# Patient Record
Sex: Female | Born: 1980
Health system: Southern US, Community
[De-identification: ages and names within clinical notes are randomized; demographics above are authoritative.]

## PROBLEM LIST (undated history)

## (undated) DIAGNOSIS — Z789 Other specified health status: Secondary | ICD-10-CM

## (undated) HISTORY — PX: NO PAST SURGERIES: SHX2092

---

## 2000-01-13 ENCOUNTER — Other Ambulatory Visit: Admission: RE | Admit: 2000-01-13 | Discharge: 2000-01-13 | Payer: Self-pay | Admitting: Family Medicine

## 2000-02-14 ENCOUNTER — Ambulatory Visit (HOSPITAL_COMMUNITY): Admission: RE | Admit: 2000-02-14 | Discharge: 2000-02-14 | Payer: Self-pay | Admitting: Family Medicine

## 2000-02-14 ENCOUNTER — Encounter: Payer: Self-pay | Admitting: Family Medicine

## 2000-07-26 ENCOUNTER — Inpatient Hospital Stay (HOSPITAL_COMMUNITY): Admission: AD | Admit: 2000-07-26 | Discharge: 2000-07-28 | Payer: Self-pay | Admitting: Family Medicine

## 2001-12-19 ENCOUNTER — Other Ambulatory Visit: Admission: RE | Admit: 2001-12-19 | Discharge: 2001-12-19 | Payer: Self-pay | Admitting: Unknown Physician Specialty

## 2001-12-19 ENCOUNTER — Other Ambulatory Visit: Admission: RE | Admit: 2001-12-19 | Discharge: 2001-12-19 | Payer: Self-pay | Admitting: Family Medicine

## 2002-02-15 ENCOUNTER — Encounter: Payer: Self-pay | Admitting: Family Medicine

## 2002-02-15 ENCOUNTER — Ambulatory Visit (HOSPITAL_COMMUNITY): Admission: RE | Admit: 2002-02-15 | Discharge: 2002-02-15 | Payer: Self-pay | Admitting: Family Medicine

## 2002-04-26 ENCOUNTER — Ambulatory Visit (HOSPITAL_COMMUNITY): Admission: RE | Admit: 2002-04-26 | Discharge: 2002-04-26 | Payer: Self-pay | Admitting: Family Medicine

## 2002-04-26 ENCOUNTER — Encounter: Payer: Self-pay | Admitting: Family Medicine

## 2002-07-21 ENCOUNTER — Inpatient Hospital Stay (HOSPITAL_COMMUNITY): Admission: AD | Admit: 2002-07-21 | Discharge: 2002-07-23 | Payer: Self-pay | Admitting: Family Medicine

## 2005-11-16 ENCOUNTER — Other Ambulatory Visit: Admission: RE | Admit: 2005-11-16 | Discharge: 2005-11-16 | Payer: Self-pay | Admitting: Obstetrics and Gynecology

## 2006-02-10 ENCOUNTER — Ambulatory Visit (HOSPITAL_COMMUNITY): Admission: RE | Admit: 2006-02-10 | Discharge: 2006-02-10 | Payer: Self-pay | Admitting: Obstetrics and Gynecology

## 2006-06-25 ENCOUNTER — Inpatient Hospital Stay (HOSPITAL_COMMUNITY): Admission: AD | Admit: 2006-06-25 | Discharge: 2006-06-27 | Payer: Self-pay | Admitting: Obstetrics and Gynecology

## 2008-01-13 IMAGING — US US OB COMP +14 WK
1 series · 13 of 28 positions shown · non-contrast
Comparison: none

CLINICAL DATA: Evaluate anatomy.

[Series 1: us ob comp +14 wk · 0.35mm/px · 13 of 94 slices shown]
[im 4/94]
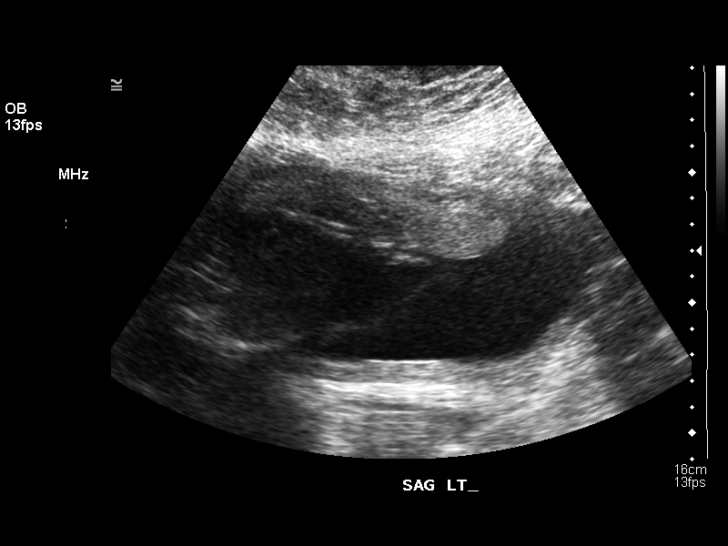
[im 11/94]
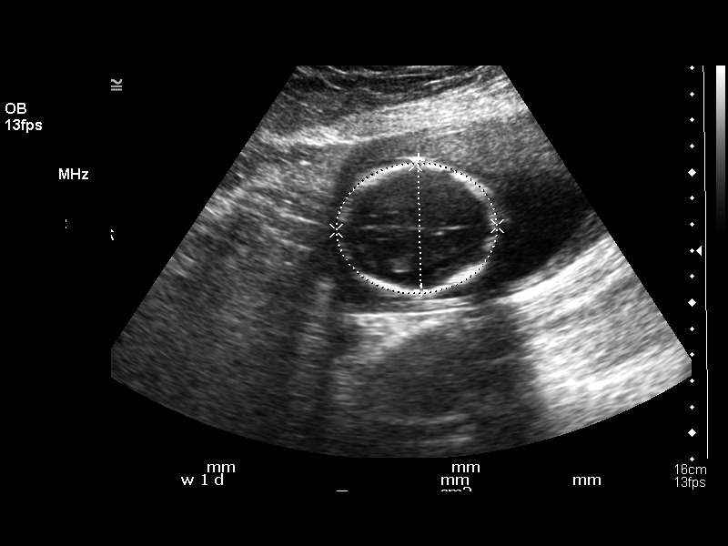
[im 18/94]
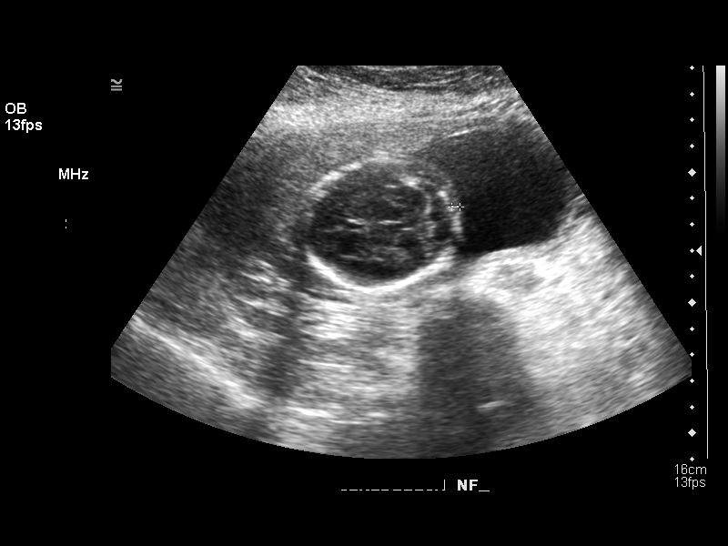
[im 25/94]
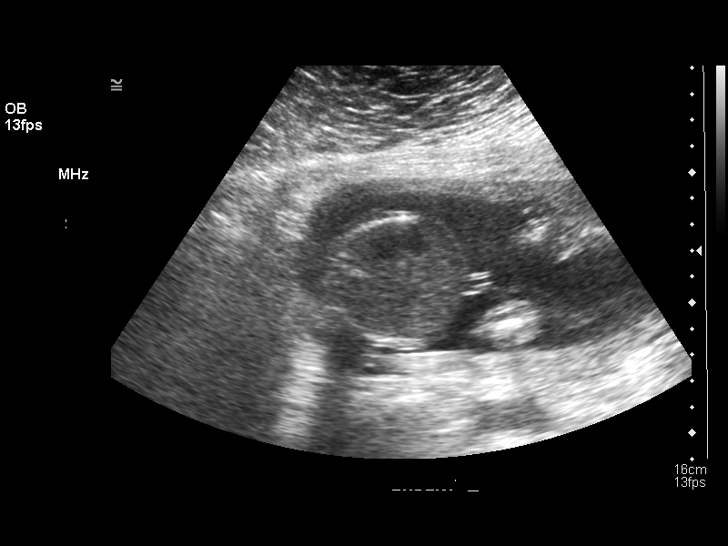
[im 32/94]
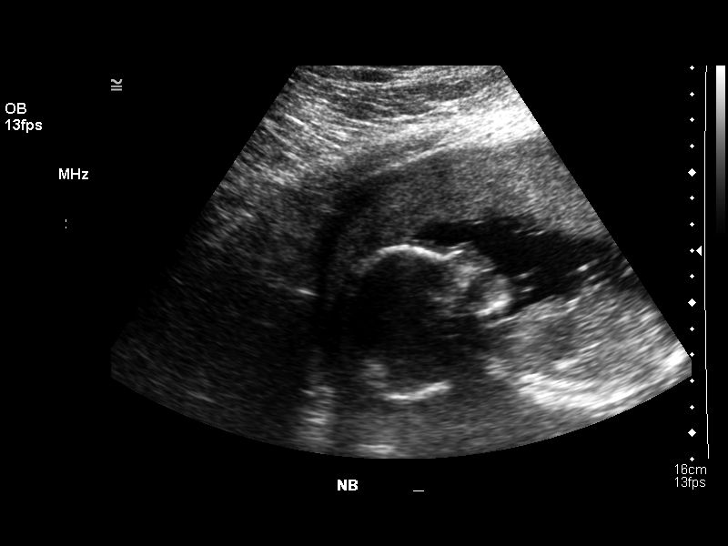
[im 38/94]
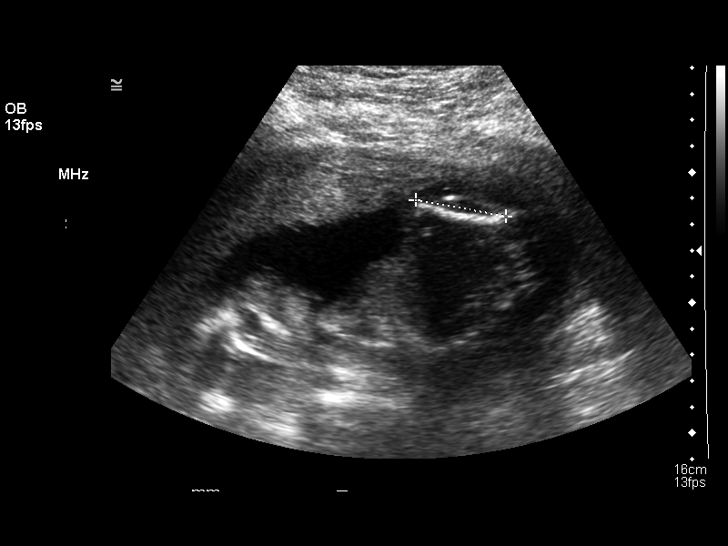
[im 49/94]
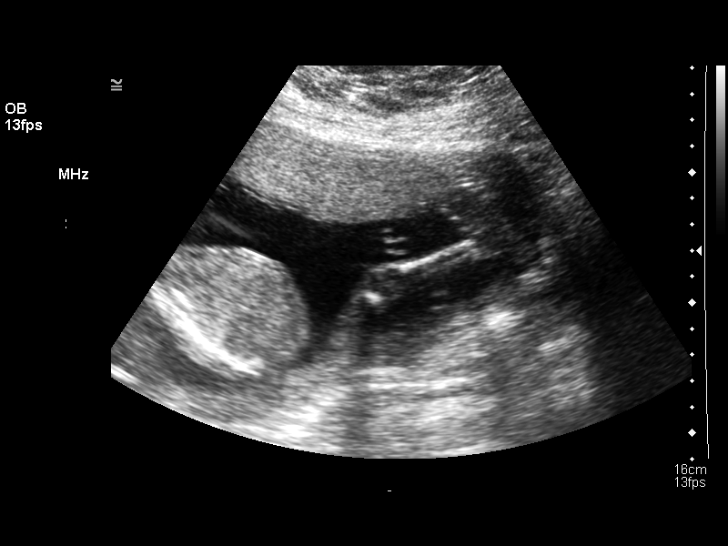
[im 56/94]
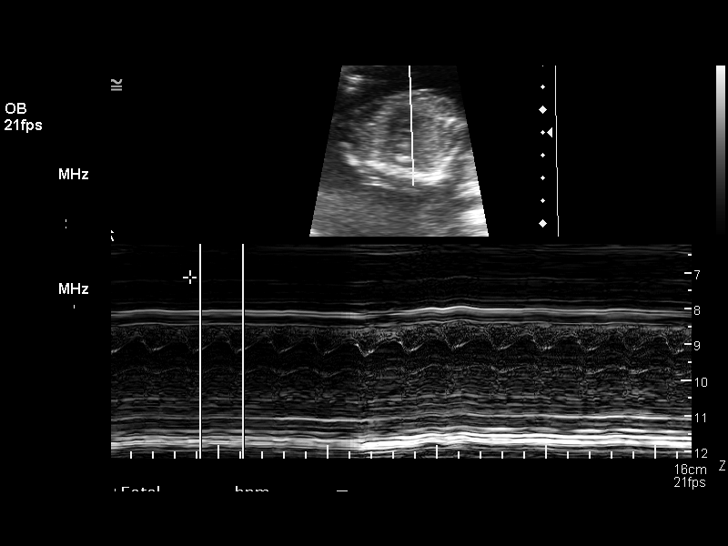
[im 63/94]
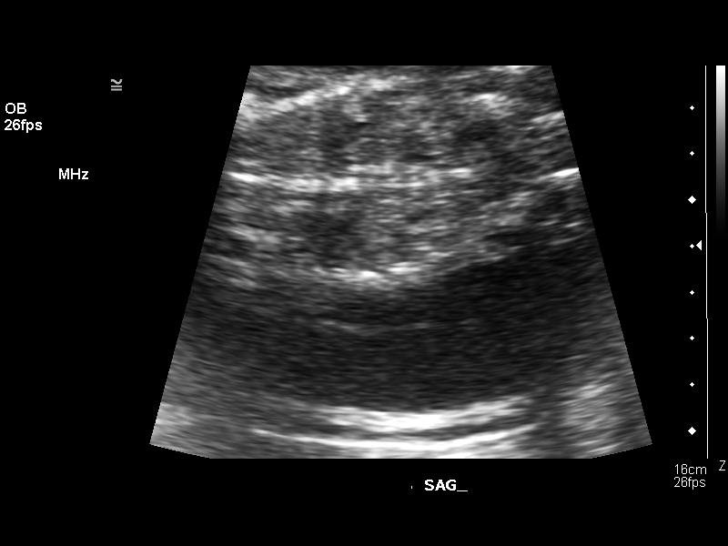
[im 69/94]
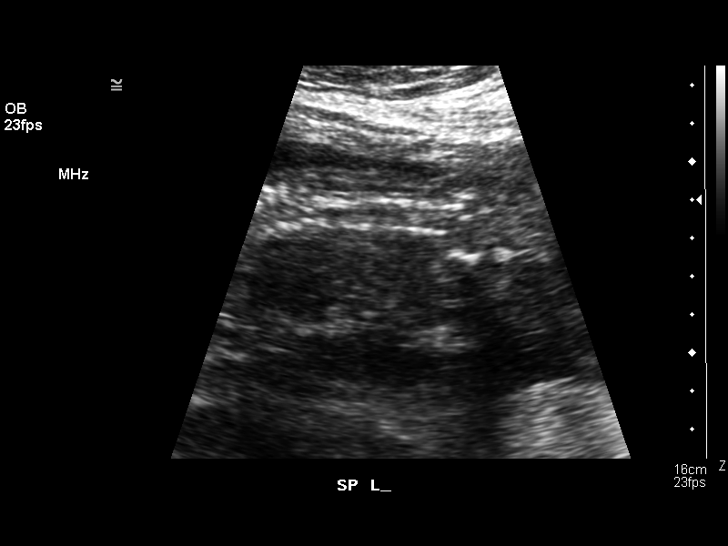
[im 76/94]
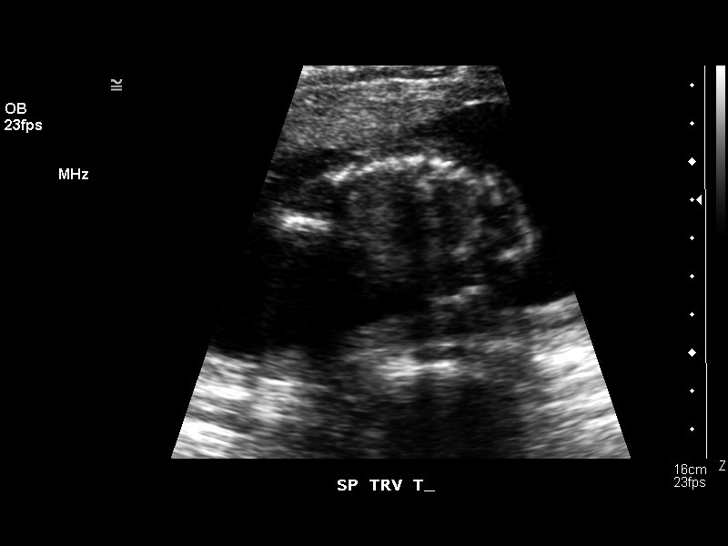
[im 83/94]
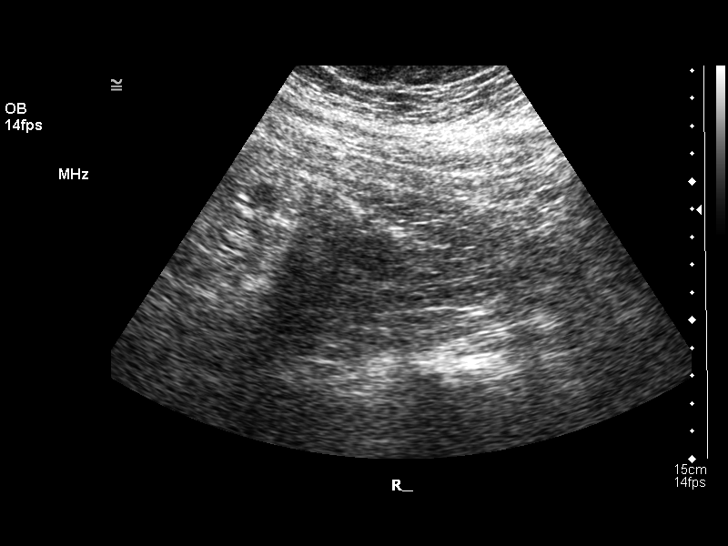
[im 90/94]
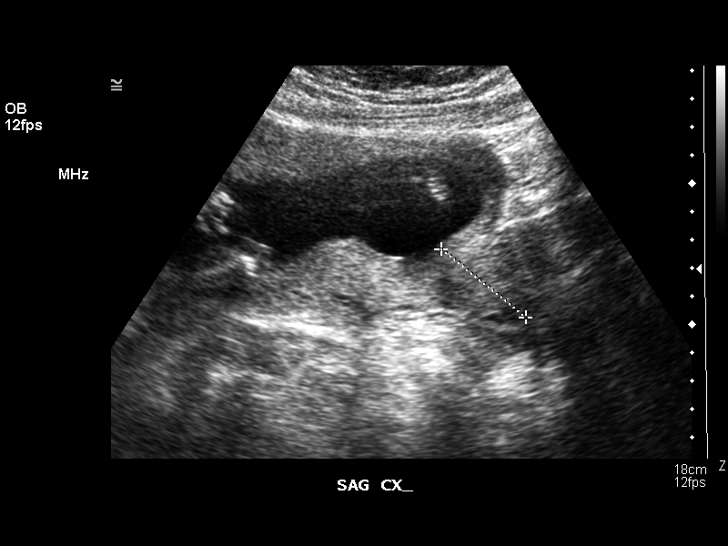

[13 of 28 positions shown; findings below may reference images not displayed]

OBSTETRICAL ULTRASOUND:
 Number of Fetuses: 1
 Heart Rate:  153
 Movement:  Yes
 Breathing:  No  
 Presentation:  Breech
 Placental Location:  Anterior
 Grade:  I
 Previa:  No
 Amniotic Fluid (Subjective):  Normal
 Amniotic Fluid (Objective):   4.7 cm Vertical pocket 

 FETAL BIOMETRY
 BPD:   4.9 cm  20 w 6 d
 HC:   17.5 cm   20 w 1 d
 AC:   15.5 cm   20 w 5 d
 FL:   3.4 cm   20 w 6 d

 MEAN GA:  20 w 5 d  US EDC:  06/25/06
 Assigned GA:  20 w 4 d  Assigned EDC:  06/26/06

 FETAL ANATOMY
 Lateral Ventricles:    Visualized 
 Thalami/CSP:      Visualized 
 Posterior Fossa:  Visualized 
 Nuchal Region:  NF= 3.1 mm  Visualized 
 Spine:      Visualized 
 4 Chamber Heart on Left:      Visualized 
 Stomach on Left:      Visualized 
 3 Vessel Cord:    Visualized 
 Cord Insertion site:    Visualized 
 Kidneys:  Visualized 
 Bladder:  Visualized 
 Extremities:      Visualized 

 ADDITIONAL ANATOMY VISUALIZED: LVOT, RVOT, upper lip, orbits, profile, diaphragm, heel, 5th digit, ductal arch, aortic arch, and male genitalia.

 MATERNAL UTERINE AND ADNEXAL FINDINGS
 Cervix: 3.8 cm transabdominally.  Ovaries not seen.
IMPRESSION: 1.  Single living intrauterine fetus in breech presentation with subjectively normal amniotic fluid volume.  Estimated mean gestational age by ultrasound today is 20 weeks 5 days which correlates well with the reported assigned gestational age.
 2.  Assessment of anatomy is challenging secondary to the patient?s very large maternal habitus.  Visualized fetal anatomy is unremarkable in appearance, although, again image quality is degraded by the patient?s body habitus.

## 2010-08-29 NOTE — L&D Delivery Note (Signed)
Delivery Note At 5:02 AM a healthy female was delivered via Vaginal, Spontaneous Delivery (Presentation: Left Occiput Anterior).  APGAR: 9, 9 ; weight: pending. Placenta status:delivered intact .  Cord: 3 vessel  with the following complications: none  Anesthesia: Epidural  Episiotomy: None Lacerations: None Est. Blood Loss (mL): <500 mL   Mom to postpartum.  Baby to nursery-stable.  Case supervised by Maylon Cos, CNM  HUNTER, STEPHEN 08/25/2011, 5:24 AM

## 2011-02-10 LAB — HIV ANTIBODY (ROUTINE TESTING W REFLEX): HIV: NONREACTIVE

## 2011-02-10 LAB — ABO/RH

## 2011-02-10 LAB — HEPATITIS B SURFACE ANTIGEN: Hepatitis B Surface Ag: NEGATIVE

## 2011-02-10 LAB — RUBELLA ANTIBODY, IGM: Rubella: IMMUNE

## 2011-03-07 ENCOUNTER — Other Ambulatory Visit (HOSPITAL_COMMUNITY)
Admission: RE | Admit: 2011-03-07 | Discharge: 2011-03-07 | Disposition: A | Discharge status: Home / Self Care | Payer: Commercial Indemnity | Source: Ambulatory Visit | Attending: Obstetrics and Gynecology | Admitting: Obstetrics and Gynecology

## 2011-03-07 ENCOUNTER — Other Ambulatory Visit: Payer: Self-pay | Admitting: Family Medicine

## 2011-03-07 DIAGNOSIS — Z01419 Encounter for gynecological examination (general) (routine) without abnormal findings: Secondary | ICD-10-CM | POA: Insufficient documentation

## 2011-03-07 DIAGNOSIS — Z113 Encounter for screening for infections with a predominantly sexual mode of transmission: Secondary | ICD-10-CM | POA: Insufficient documentation

## 2011-08-24 ENCOUNTER — Encounter (HOSPITAL_COMMUNITY): Payer: Self-pay | Admitting: *Deleted

## 2011-08-24 ENCOUNTER — Inpatient Hospital Stay (HOSPITAL_COMMUNITY)
Admission: AD | Admit: 2011-08-24 | Discharge: 2011-08-26 | DRG: 775 | Disposition: A | Discharge status: Home / Self Care | Payer: Managed Care, Other (non HMO) | Source: Ambulatory Visit | Attending: Obstetrics & Gynecology | Admitting: Obstetrics & Gynecology

## 2011-08-24 DIAGNOSIS — O99892 Other specified diseases and conditions complicating childbirth: Principal | ICD-10-CM | POA: Diagnosis present

## 2011-08-24 DIAGNOSIS — Z22330 Carrier of Group B streptococcus: Secondary | ICD-10-CM

## 2011-08-24 DIAGNOSIS — Z3490 Encounter for supervision of normal pregnancy, unspecified, unspecified trimester: Secondary | ICD-10-CM

## 2011-08-24 HISTORY — DX: Other specified health status: Z78.9

## 2011-08-24 MED ORDER — DIPHENHYDRAMINE HCL 50 MG/ML IJ SOLN: 12.5000 mg | INTRAMUSCULAR | Status: DC | PRN

## 2011-08-24 MED ORDER — LACTATED RINGERS IV SOLN
500.0000 mL | INTRAVENOUS | Status: DC | PRN
  Administered 2011-08-25: 300 mL via INTRAVENOUS

## 2011-08-24 MED ORDER — PENICILLIN G POTASSIUM 5000000 UNITS IJ SOLR
5.0000 10*6.[IU] | Freq: Once | INTRAMUSCULAR | Status: AC
  Administered 2011-08-25: 5 10*6.[IU] via INTRAVENOUS
  Filled 2011-08-24: qty 5

## 2011-08-24 MED ORDER — ACETAMINOPHEN 325 MG PO TABS: 650.0000 mg | ORAL_TABLET | ORAL | Status: DC | PRN

## 2011-08-24 MED ORDER — OXYTOCIN BOLUS FROM INFUSION
500.0000 mL | Freq: Once | INTRAVENOUS | Status: DC
  Filled 2011-08-24: qty 1000
  Filled 2011-08-24: qty 500

## 2011-08-24 MED ORDER — PENICILLIN G POTASSIUM 5000000 UNITS IJ SOLR
2.5000 10*6.[IU] | INTRAMUSCULAR | Status: DC
  Administered 2011-08-25: 2.5 10*6.[IU] via INTRAVENOUS
  Filled 2011-08-24 (×4): qty 2.5

## 2011-08-24 MED ORDER — EPHEDRINE 5 MG/ML INJ: 10.0000 mg | INTRAVENOUS | Status: DC | PRN

## 2011-08-24 MED ORDER — LACTATED RINGERS IV SOLN
500.0000 mL | Freq: Once | INTRAVENOUS | Status: AC
  Administered 2011-08-25: 1000 mL via INTRAVENOUS

## 2011-08-24 MED ORDER — PANTOPRAZOLE SODIUM 40 MG PO TBEC
40.0000 mg | DELAYED_RELEASE_TABLET | Freq: Every day | ORAL | Status: DC
  Filled 2011-08-24 (×2): qty 1

## 2011-08-24 MED ORDER — FENTANYL 2.5 MCG/ML BUPIVACAINE 1/10 % EPIDURAL INFUSION (WH - ANES)
14.0000 mL/h | INTRAMUSCULAR | Status: DC
  Administered 2011-08-25: 14 mL/h via EPIDURAL
  Filled 2011-08-24 (×2): qty 60

## 2011-08-24 MED ORDER — PHENYLEPHRINE 40 MCG/ML (10ML) SYRINGE FOR IV PUSH (FOR BLOOD PRESSURE SUPPORT)
80.0000 ug | PREFILLED_SYRINGE | INTRAVENOUS | Status: DC | PRN
  Filled 2011-08-24: qty 5

## 2011-08-24 MED ORDER — EPHEDRINE 5 MG/ML INJ
10.0000 mg | INTRAVENOUS | Status: DC | PRN
  Administered 2011-08-25: 10 mg via INTRAVENOUS
  Filled 2011-08-24: qty 4

## 2011-08-24 MED ORDER — OXYTOCIN 20 UNITS IN LACTATED RINGERS INFUSION - SIMPLE
125.0000 mL/h | Freq: Once | INTRAVENOUS | Status: AC
  Administered 2011-08-25: 500 mL/h via INTRAVENOUS

## 2011-08-24 MED ORDER — PRENATAL MULTIVITAMIN CH: 1.0000 | ORAL_TABLET | Freq: Every day | ORAL | Status: DC

## 2011-08-24 MED ORDER — ONDANSETRON HCL 4 MG/2ML IJ SOLN: 4.0000 mg | Freq: Four times a day (QID) | INTRAMUSCULAR | Status: DC | PRN

## 2011-08-24 MED ORDER — NALBUPHINE SYRINGE 5 MG/0.5 ML: 10.0000 mg | INJECTION | INTRAMUSCULAR | Status: DC | PRN

## 2011-08-24 MED ORDER — OXYCODONE-ACETAMINOPHEN 5-325 MG PO TABS: 2.0000 | ORAL_TABLET | ORAL | Status: DC | PRN

## 2011-08-24 MED ORDER — LIDOCAINE HCL (PF) 1 % IJ SOLN
30.0000 mL | INTRAMUSCULAR | Status: DC | PRN
  Filled 2011-08-24: qty 30

## 2011-08-24 MED ORDER — FLEET ENEMA 7-19 GM/118ML RE ENEM: 1.0000 | ENEMA | RECTAL | Status: DC | PRN

## 2011-08-24 MED ORDER — IBUPROFEN 600 MG PO TABS
600.0000 mg | ORAL_TABLET | Freq: Four times a day (QID) | ORAL | Status: DC | PRN
  Filled 2011-08-24: qty 1

## 2011-08-24 MED ORDER — LACTATED RINGERS IV SOLN
INTRAVENOUS | Status: DC
  Administered 2011-08-25 (×2): via INTRAVENOUS

## 2011-08-24 MED ORDER — PHENYLEPHRINE 40 MCG/ML (10ML) SYRINGE FOR IV PUSH (FOR BLOOD PRESSURE SUPPORT): 80.0000 ug | PREFILLED_SYRINGE | INTRAVENOUS | Status: DC | PRN

## 2011-08-24 MED ORDER — CITRIC ACID-SODIUM CITRATE 334-500 MG/5ML PO SOLN: 30.0000 mL | ORAL | Status: DC | PRN

## 2011-08-24 NOTE — H&P (Signed)
RASHEEMA TRULUCK 30 y.o. female  [phone] at [redacted]w[redacted]d presenting with contractions. Patient of [facility].  Patient states has been having some irregular contractions today that have increased to about every 10 minutes. Her main concern is that they are now in her lower abdomen instead of her back as she has felt in recent weeks. THis felt different to her so she wanted to be evaluated. Patient denies decreased fetal movement/abnormal discharge/blood from vagina/rush of fluid.   Patient walked for 1 hour in MAU then her contractions became increasingly painful. She was able to breath through them.    History OB History    Grav Para Term Preterm Abortions TAB SAB Ect Mult Living   4 3 3  0 0 0 0 0 0 3    has had baby's typically high 6 lb or low 7 lbs.   Past Medical History  Diagnosis Date  . No pertinent past medical history    Past Surgical History  Procedure Date  . No past surgeries    Family History: family history is negative for Anesthesia problems, and Hypotension, and Malignant hyperthermia, . Social History:  reports that she has never smoked. She does not have any smokeless tobacco history on file. She reports that she does not drink alcohol or use illicit drugs. Lives with husband and 3 kids  ROS negative except as noted in HPI    Dilation: 5 Effacement (%): 70;90 Station: -1 (bulging bag ) Exam by:: Dr Durene Cal, confirmed by Arlys John RN  Blood pressure 137/70, pulse 90, temperature 98.2 F (36.8 C), resp. rate 16, height 5\' 7"  (1.702 m), weight 109.77 kg (242 lb), SpO2 99.00%. Maternal Exam:  Introitus: Vagina is negative for discharge.    Fetal Exam Fetal Monitor Review: Baseline rate: 120. Contractions now every 3-4 minutes. .  Variability: moderate (6-25 bpm).   Pattern: accelerations present and no decelerations.    Fetal State Assessment: Category I - tracings are normal.     Physical Exam  Constitutional: She is oriented to person, place, and time.  She appears well-developed and well-nourished. She appears distressed (with contractions).  Cardiovascular: Normal rate and regular rhythm.  Exam reveals no gallop and no friction rub.   No murmur heard. Respiratory: Breath sounds normal. No respiratory distress. She has no wheezes. She has no rales.  GI:       Gravid. Size consistent with term.    Genitourinary: Vagina normal. No vaginal discharge found.  Musculoskeletal: Normal range of motion. She exhibits no edema.  Neurological: She is alert and oriented to person, place, and time.    Prenatal labs: ABO, Rh:   A positive Antibody:   negative Rubella:   immune RPR:   negative HBsAg:   negative HIV:   non reactive GBS:   positive  Assessment/Plan: #1 [id number] at [redacted]w[redacted]d #2 Active Labor #3 GBS positive.   Admit to L+D. Expectant management. PCN for GBS status. Patient unsure whether she desires epidural but may have upon request.    Case discussed with Maylon Cos, CNM  HUNTER, STEPHEN 08/24/2011, 11:27 PM

## 2011-08-24 NOTE — ED Notes (Signed)
Dr Durene Cal notified pt has returned from walk and ready for recheck

## 2011-08-24 NOTE — ED Notes (Signed)
Pt Hunter in with pt

## 2011-08-24 NOTE — Progress Notes (Signed)
Pt reports contractions since last pm. Now q 10 minutes.denies bleeding or ROM. SVE 3 cm last week. G4P3

## 2011-08-24 NOTE — Progress Notes (Signed)
Pt in for labor eval, reports off and on since Saturday, worse today and "different".  Reports ucs q10 minutes.  Denies any bleeding or leaking of fluid.  Was 3 cm last week.  + FM.

## 2011-08-25 ENCOUNTER — Inpatient Hospital Stay (HOSPITAL_COMMUNITY): Payer: Managed Care, Other (non HMO) | Admitting: Anesthesiology

## 2011-08-25 ENCOUNTER — Encounter (HOSPITAL_COMMUNITY): Payer: Self-pay | Admitting: *Deleted

## 2011-08-25 ENCOUNTER — Encounter (HOSPITAL_COMMUNITY): Payer: Self-pay | Admitting: Anesthesiology

## 2011-08-25 DIAGNOSIS — Z3490 Encounter for supervision of normal pregnancy, unspecified, unspecified trimester: Secondary | ICD-10-CM

## 2011-08-25 LAB — CBC
HCT: 38.3 % (ref 36.0–46.0)
MCHC: 32.6 g/dL (ref 30.0–36.0)
Platelets: 155 10*3/uL (ref 150–400)
RDW: 14.2 % (ref 11.5–15.5)

## 2011-08-25 MED ORDER — ONDANSETRON HCL 4 MG/2ML IJ SOLN: 4.0000 mg | INTRAMUSCULAR | Status: DC | PRN

## 2011-08-25 MED ORDER — DIPHENHYDRAMINE HCL 25 MG PO CAPS: 25.0000 mg | ORAL_CAPSULE | Freq: Four times a day (QID) | ORAL | Status: DC | PRN

## 2011-08-25 MED ORDER — SIMETHICONE 80 MG PO CHEW: 80.0000 mg | CHEWABLE_TABLET | ORAL | Status: DC | PRN

## 2011-08-25 MED ORDER — ONDANSETRON HCL 4 MG PO TABS: 4.0000 mg | ORAL_TABLET | ORAL | Status: DC | PRN

## 2011-08-25 MED ORDER — TETANUS-DIPHTH-ACELL PERTUSSIS 5-2.5-18.5 LF-MCG/0.5 IM SUSP: 0.5000 mL | Freq: Once | INTRAMUSCULAR | Status: DC

## 2011-08-25 MED ORDER — WITCH HAZEL-GLYCERIN EX PADS: 1.0000 "application " | MEDICATED_PAD | CUTANEOUS | Status: DC | PRN

## 2011-08-25 MED ORDER — SENNOSIDES-DOCUSATE SODIUM 8.6-50 MG PO TABS
2.0000 | ORAL_TABLET | Freq: Every day | ORAL | Status: DC
  Administered 2011-08-25: 2 via ORAL

## 2011-08-25 MED ORDER — PRENATAL MULTIVITAMIN CH
1.0000 | ORAL_TABLET | Freq: Every day | ORAL | Status: DC
  Administered 2011-08-25 – 2011-08-26 (×2): 1 via ORAL
  Filled 2011-08-25 (×2): qty 1

## 2011-08-25 MED ORDER — OXYCODONE-ACETAMINOPHEN 5-325 MG PO TABS
1.0000 | ORAL_TABLET | ORAL | Status: DC | PRN
  Administered 2011-08-25 – 2011-08-26 (×2): 1 via ORAL
  Filled 2011-08-25 (×2): qty 1

## 2011-08-25 MED ORDER — LANOLIN HYDROUS EX OINT: TOPICAL_OINTMENT | CUTANEOUS | Status: DC | PRN

## 2011-08-25 MED ORDER — FENTANYL 2.5 MCG/ML BUPIVACAINE 1/10 % EPIDURAL INFUSION (WH - ANES)
INTRAMUSCULAR | Status: DC | PRN
  Administered 2011-08-25: 15 mL/h via EPIDURAL

## 2011-08-25 MED ORDER — LIDOCAINE HCL 1.5 % IJ SOLN
INTRAMUSCULAR | Status: DC | PRN
  Administered 2011-08-25 (×2): 4 mL via EPIDURAL

## 2011-08-25 MED ORDER — BENZOCAINE-MENTHOL 20-0.5 % EX AERO: 1.0000 "application " | INHALATION_SPRAY | CUTANEOUS | Status: DC | PRN

## 2011-08-25 MED ORDER — DIBUCAINE 1 % RE OINT: 1.0000 "application " | TOPICAL_OINTMENT | RECTAL | Status: DC | PRN

## 2011-08-25 MED ORDER — IBUPROFEN 600 MG PO TABS
600.0000 mg | ORAL_TABLET | Freq: Four times a day (QID) | ORAL | Status: DC
  Administered 2011-08-25 – 2011-08-26 (×5): 600 mg via ORAL
  Filled 2011-08-25 (×4): qty 1

## 2011-08-25 MED ORDER — ZOLPIDEM TARTRATE 5 MG PO TABS: 5.0000 mg | ORAL_TABLET | Freq: Every evening | ORAL | Status: DC | PRN

## 2011-08-25 NOTE — Progress Notes (Signed)
Angela Callahan is a 30 y.o. [phone] at [redacted]w[redacted]d  admitted for active labor  Subjective: Comfortable. Just finished enjoying a short nap.   Objective: BP 103/46  Pulse 76  Temp(Src) 97.9 F (36.6 C) (Axillary)  Resp 18  Ht 5\' 7"  (1.702 m)  Wt 109.77 kg (242 lb)  BMI 37.90 kg/m2  SpO2 100%      FHT:  FHR: 120 bpm, variability: moderate,  accelerations:  Present,  decelerations:  Absent UC:   regular, every 5-6 minutes SVE:   Dilation: 6.5 Effacement (%): 100 Station: -2 Exam by:: e.foley,rn Now 8/100/-1  Labs: Lab Results  Component Value Date   WBC 11.6* 08/24/2011   HGB 12.5 08/24/2011   HCT 38.3 08/24/2011   MCV 93.4 08/24/2011   PLT 155 08/24/2011    Assessment / Plan: Spontaneous labor, progressing normally. AROM.   Labor: Progressing normally Fetal Wellbeing:  Category I Pain Control:  Epidural I/D:  PCN Anticipated MOD:  NSVD  HUNTER, STEPHEN 08/25/2011, 3:35 AM

## 2011-08-25 NOTE — Anesthesia Procedure Notes (Signed)
Epidural Patient location during procedure: OB Start time: 08/25/2011 12:28 AM  Staffing Anesthesiologist: Maysel Mccolm A. Performed by: anesthesiologist   Preanesthetic Checklist Completed: patient identified, site marked, surgical consent, pre-op evaluation, timeout performed, IV checked, risks and benefits discussed and monitors and equipment checked  Epidural Patient position: sitting Prep: site prepped and draped and DuraPrep Patient monitoring: continuous pulse ox and blood pressure Approach: midline Injection technique: LOR air  Needle:  Needle type: Tuohy  Needle gauge: 17 G Needle length: 9 cm Needle insertion depth: 6 cm Catheter type: closed end flexible Catheter size: 19 Gauge Catheter at skin depth: 11 cm Test dose: negative and 1.5% lidocaine  Assessment Events: blood not aspirated, injection not painful, no injection resistance, negative IV test and no paresthesia  Additional Notes Patient is more comfortable after epidural dosed. Please see RN's note for documentation of vital signs and FHR which are stable.

## 2011-08-25 NOTE — Anesthesia Postprocedure Evaluation (Signed)
  Anesthesia Post Note  Patient: Angela Callahan  Procedure(s) Performed: * No procedures listed *  Anesthesia type: Epidural  Patient location: Mother/Baby  Post pain: Pain level controlled  Post assessment: Post-op Vital signs reviewed  Last Vitals:  Filed Vitals:   08/25/11 1145  BP: 111/71  Pulse: 93  Temp: 36.7 C  Resp: 18    Post vital signs: Reviewed  Level of consciousness:alert  Complications: No apparent anesthesia complications

## 2011-08-25 NOTE — Anesthesia Preprocedure Evaluation (Signed)
Anesthesia Evaluation  Patient identified by MRN, date of birth, ID band Patient awake    Reviewed: Allergy & Precautions, H&P , Patient's Chart, lab work & pertinent test results  Airway Mallampati: III TM Distance: >3 FB Neck ROM: full    Dental No notable dental hx. (+) Teeth Intact   Pulmonary neg pulmonary ROS,  clear to auscultation  Pulmonary exam normal       Cardiovascular neg cardio ROS regular Normal    Neuro/Psych Negative Neurological ROS  Negative Psych ROS   GI/Hepatic negative GI ROS, Neg liver ROS,   Endo/Other  Negative Endocrine ROSMorbid obesity  Renal/GU negative Renal ROS  Genitourinary negative   Musculoskeletal   Abdominal Normal abdominal exam  (+)   Peds  Hematology negative hematology ROS (+)   Anesthesia Other Findings   Reproductive/Obstetrics (+) Pregnancy                           Anesthesia Physical Anesthesia Plan  ASA: III  Anesthesia Plan: Epidural   Post-op Pain Management:    Induction:   Airway Management Planned:   Additional Equipment:   Intra-op Plan:   Post-operative Plan:   Informed Consent: I have reviewed the patients History and Physical, chart, labs and discussed the procedure including the risks, benefits and alternatives for the proposed anesthesia with the patient or authorized representative who has indicated his/her understanding and acceptance.     Plan Discussed with: Anesthesiologist  Anesthesia Plan Comments:         Anesthesia Quick Evaluation

## 2011-08-25 NOTE — Progress Notes (Signed)
Pt transferred to [facility] room 116 via stretcher at 0620 with support person present.  Report given to mbe rn.

## 2011-08-26 MED ORDER — IBUPROFEN 600 MG PO TABS: 600.0000 mg | ORAL_TABLET | Freq: Four times a day (QID) | ORAL | Status: AC

## 2011-08-26 MED ORDER — PRENATAL MULTIVITAMIN CH: 1.0000 | ORAL_TABLET | Freq: Every day | ORAL | Status: DC

## 2011-08-26 NOTE — Discharge Summary (Signed)
Obstetric Discharge Summary Reason for Admission: onset of labor Prenatal Procedures: NST Intrapartum Procedures: spontaneous vaginal delivery Postpartum Procedures: none Complications-Operative and Postpartum: none Hemoglobin  Date Value Range Status  08/24/2011 12.5  12.0-15.0 (g/dL) Final     HCT  Date Value Range Status  08/24/2011 38.3  36.0-46.0 (%) Final    Discharge Diagnoses: Term Pregnancy-delivered Angela Callahan 30 y.o. female  [id number] who at [redacted]w[redacted]d delivered a healthy female via Vaginal, Spontaneous Delivery (Presentation: Left Occiput Anterior).  APGAR: 9, 9 ; weight: pending. Placenta status:delivered intact .  Cord: 3 vessel  with the following complications: none  Anesthesia: Epidural  Episiotomy: None Lacerations: None Est. Blood Loss (mL): <500 mL   Mother plans to breast/bottle feed. Use condoms for birth control and do a circumcision at [facility]  Discharge Information: Date: 08/26/2011 Activity: pelvic rest Diet: routine Medications: PNV, Ibuprofen and PPI Condition: stable Instructions: refer to practice specific booklet Discharge to: home Follow-up Information    Follow up with Tilda Burrow, MD. Make an appointment in 6 weeks.   Contact information:   [facility] [address], [address] [place] [address] [phone]          Newborn Data: Live born female  Birth Weight: 7 lb 15.7 oz (3620 g) APGAR: 9, 9  Home with mother.  Angela Callahan 08/26/2011, 7:55 AM

## 2011-08-26 NOTE — Progress Notes (Signed)
Post Partum Day 1 SVD Subjective: no complaints, up ad lib, voiding, tolerating PO and + flatus  Objective: Blood pressure 108/64, pulse 89, temperature 98.1 F (36.7 C), temperature source Oral, resp. rate 18, height 5\' 7"  (1.702 m), weight 109.77 kg (242 lb), SpO2 96.00%, unknown if currently breastfeeding.  Physical Exam:  General: alert, cooperative and no distress Lochia: appropriate Uterine Fundus: firm DVT Evaluation: No cords or calf tenderness. No significant calf/ankle edema.   Basename 08/24/11 2356  HGB 12.5  HCT 38.3    Assessment/Plan: Discharge home, Breastfeeding and Contraception condoms. Patient requests early discharge, will hold discharge if child not cleared. Also doing some bottle feeding.    LOS: 2 days   Daisey Caloca 08/26/2011, 7:51 AM

## 2011-08-26 NOTE — Consult Note (Signed)
Lactation note : Mom's feeding preference Breast /Bottle , per mom I  have decided only to bottle formula .

## 2011-08-29 NOTE — Discharge Summary (Signed)
Agree with above note.  Angela Callahan 08/29/2011 1:15 PM

## 2014-06-30 ENCOUNTER — Encounter (HOSPITAL_COMMUNITY): Payer: Self-pay | Admitting: *Deleted

## 2014-11-14 ENCOUNTER — Telehealth: Payer: Self-pay | Admitting: Nurse Practitioner

## 2014-11-14 NOTE — Telephone Encounter (Signed)
Pt given appt with MMM 3/31 at 12:30 ok per MMM.

## 2014-11-27 ENCOUNTER — Ambulatory Visit (INDEPENDENT_AMBULATORY_CARE_PROVIDER_SITE_OTHER): Payer: BLUE CROSS/BLUE SHIELD | Admitting: Nurse Practitioner

## 2014-11-27 ENCOUNTER — Encounter: Payer: Self-pay | Admitting: Nurse Practitioner

## 2014-11-27 ENCOUNTER — Ambulatory Visit (INDEPENDENT_AMBULATORY_CARE_PROVIDER_SITE_OTHER): Payer: BLUE CROSS/BLUE SHIELD

## 2014-11-27 VITALS — BP 137/82 | HR 92 | Temp 99.6°F | Ht 67.0 in | Wt 223.6 lb

## 2014-11-27 DIAGNOSIS — Z6835 Body mass index (BMI) 35.0-35.9, adult: Secondary | ICD-10-CM

## 2014-11-27 DIAGNOSIS — Z0000 Encounter for general adult medical examination without abnormal findings: Secondary | ICD-10-CM | POA: Diagnosis not present

## 2014-11-27 DIAGNOSIS — R109 Unspecified abdominal pain: Secondary | ICD-10-CM | POA: Diagnosis not present

## 2014-11-27 LAB — POCT CBC
GRANULOCYTE PERCENT: 67 % (ref 37–80)
HEMATOCRIT: 43.3 % (ref 37.7–47.9)
Hemoglobin: 13.8 g/dL (ref 12.2–16.2)
Lymph, poc: 1.6 (ref 0.6–3.4)
MCH, POC: 29 pg (ref 27–31.2)
MCHC: 31.8 g/dL (ref 31.8–35.4)
MCV: 91.3 fL (ref 80–97)
MPV: 8.1 fL (ref 0–99.8)
PLATELET COUNT, POC: 221 10*3/uL (ref 142–424)
POC GRANULOCYTE: 4 (ref 2–6.9)
POC LYMPH PERCENT: 26.5 %L (ref 10–50)
RBC: 4.74 M/uL (ref 4.04–5.48)
RDW, POC: 13.6 %
WBC: 6 10*3/uL (ref 4.6–10.2)

## 2014-11-27 LAB — POCT UA - MICROSCOPIC ONLY
Bacteria, U Microscopic: NEGATIVE
CASTS, UR, LPF, POC: NEGATIVE
Crystals, Ur, HPF, POC: NEGATIVE

## 2014-11-27 LAB — POCT URINALYSIS DIPSTICK
BILIRUBIN UA: NEGATIVE
Glucose, UA: NEGATIVE
Leukocytes, UA: NEGATIVE
Nitrite, UA: NEGATIVE
PH UA: 5
Protein, UA: NEGATIVE
RBC UA: NEGATIVE
Spec Grav, UA: 1.025
Urobilinogen, UA: NEGATIVE

## 2014-11-27 NOTE — Progress Notes (Signed)
Subjective:    Patient ID: CAMISHA SREY, female    DOB: 30-Oct-1980, 34 y.o.   MRN: [id number]  HPI Patient in today for annual physical without pap. Patient is c/o of left lower quadrant pain- resolved on it's own by changing diet. Still has occasional pain. Rates pain currently 1/10 but can goes as high as 4/10.     Review of Systems  Constitutional: Negative.   HENT: Negative.   Respiratory: Negative.   Cardiovascular: Negative.   Gastrointestinal: Positive for abdominal pain.  Musculoskeletal: Negative.   Neurological: Negative.   Psychiatric/Behavioral: Negative.   All other systems reviewed and are negative.      Objective:   Physical Exam  Constitutional: She is oriented to person, place, and time. She appears well-developed and well-nourished.  HENT:  Nose: Nose normal.  Mouth/Throat: Oropharynx is clear and moist.  Eyes: EOM are normal.  Neck: Trachea normal, normal range of motion and full passive range of motion without pain. Neck supple. No JVD present. Carotid bruit is not present. No thyromegaly present.  Cardiovascular: Normal rate, regular rhythm, normal heart sounds and intact distal pulses.  Exam reveals no gallop and no friction rub.   No murmur heard. Pulmonary/Chest: Effort normal and breath sounds normal.  Abdominal: Soft. Bowel sounds are normal. She exhibits no distension and no mass. There is tenderness (left lower quadrant). There is no guarding.  Musculoskeletal: Normal range of motion.  Lymphadenopathy:    She has no cervical adenopathy.  Neurological: She is alert and oriented to person, place, and time. She displays normal reflexes.  Skin: Skin is warm and dry.  Psychiatric: She has a normal mood and affect. Her behavior is normal. Judgment and thought content normal.   BP 137/82 mmHg  Pulse 92  Temp(Src) 99.6 F (37.6 C) (Oral)  Ht 5' 7"  (1.702 m)  Wt 223 lb 9.6 oz (101.424 kg)  BMI 35.01 kg/m2  LMP 11/08/2014  Breastfeeding?  No   Results for orders placed or performed in visit on 11/27/14  POCT UA - Microscopic Only  Result Value Ref Range   WBC, Ur, HPF, POC 5-7    RBC, urine, microscopic rare    Bacteria, U Microscopic neg    Mucus, UA occ    Epithelial cells, urine per micros occ    Crystals, Ur, HPF, POC neg    Casts, Ur, LPF, POC neg    Yeast, UA occ   POCT urinalysis dipstick  Result Value Ref Range   Color, UA straw    Clarity, UA cloudy    Glucose, UA neg    Bilirubin, UA neg    Ketones, UA small    Spec Grav, UA 1.025    Blood, UA neg    pH, UA 5.0    Protein, UA neg    Urobilinogen, UA negative    Nitrite, UA neg    Leukocytes, UA Negative    KUB- normal-Mary-Margaret Hassell Done, FNP      Assessment & Plan:  1. Abdominal pain, unspecified abdominal location Watch diet Stool softner - POCT UA - Microscopic Only - POCT urinalysis dipstick - DG Abd 1 View; Future  2. Annual physical exam Low fat diet and exercise - POCT CBC - CMP14+EGFR - NMR, lipoprofile - Thyroid Panel With TSH - Vit D  25 hydroxy (rtn osteoporosis monitoring)  3. BMI 35 Discussed diet and exercise for person with BMI >25 Will recheck weight in 3-6 months   Labs pending Health maintenance  reviewed Diet and exercise encouraged Continue all meds Follow up  In 6 month    [place], FNP

## 2014-11-27 NOTE — Patient Instructions (Signed)
Exercise to Stay Healthy Exercise helps you become and stay healthy. EXERCISE IDEAS AND TIPS Choose exercises that:  You enjoy.  Fit into your day. You do not need to exercise really hard to be healthy. You can do exercises at a slow or medium level and stay healthy. You can:  Stretch before and after working out.  Try yoga, Pilates, or tai chi.  Lift weights.  Walk fast, swim, jog, run, climb stairs, bicycle, dance, or rollerskate.  Take aerobic classes. Exercises that burn about 150 calories:  Running 1  miles in 15 minutes.  Playing volleyball for 45 to 60 minutes.  Washing and waxing a car for 45 to 60 minutes.  Playing touch football for 45 minutes.  Walking 1  miles in 35 minutes.  Pushing a stroller 1  miles in 30 minutes.  Playing basketball for 30 minutes.  Raking leaves for 30 minutes.  Bicycling 5 miles in 30 minutes.  Walking 2 miles in 30 minutes.  Dancing for 30 minutes.  Shoveling snow for 15 minutes.  Swimming laps for 20 minutes.  Walking up stairs for 15 minutes.  Bicycling 4 miles in 15 minutes.  Gardening for 30 to 45 minutes.  Jumping rope for 15 minutes.  Washing windows or floors for 45 to 60 minutes. Document Released: 09/17/2010 Document Revised: 11/07/2011 Document Reviewed: 09/17/2010 Exit[employer] Patient Information 2015 [employer]. This information is not intended to replace advice given to you by your health care provider. Make sure you discuss any questions you have with your health care provider.  

## 2014-11-28 LAB — CMP14+EGFR
A/G RATIO: 1.9 (ref 1.1–2.5)
ALBUMIN: 4.4 g/dL (ref 3.5–5.5)
ALT: 13 IU/L (ref 0–32)
AST: 16 IU/L (ref 0–40)
Alkaline Phosphatase: 64 IU/L (ref 39–117)
BUN / CREAT RATIO: 14 (ref 8–20)
BUN: 11 mg/dL (ref 6–20)
Bilirubin Total: 0.3 mg/dL (ref 0.0–1.2)
CALCIUM: 9.7 mg/dL (ref 8.7–10.2)
CHLORIDE: 101 mmol/L (ref 97–108)
CO2: 23 mmol/L (ref 18–29)
CREATININE: 0.77 mg/dL (ref 0.57–1.00)
GFR calc Af Amer: 115 mL/min/{1.73_m2} (ref >59)
GFR, EST NON AFRICAN AMERICAN: 100 mL/min/{1.73_m2} (ref >59)
Globulin, Total: 2.3 g/dL (ref 1.5–4.5)
Glucose: 90 mg/dL (ref 65–99)
POTASSIUM: 4.1 mmol/L (ref 3.5–5.2)
Sodium: 139 mmol/L (ref 134–144)
TOTAL PROTEIN: 6.7 g/dL (ref 6.0–8.5)

## 2014-11-28 LAB — NMR, LIPOPROFILE
CHOLESTEROL: 122 mg/dL (ref 100–199)
HDL Cholesterol by NMR: 79 mg/dL (ref >39)
HDL PARTICLE NUMBER: 38.3 umol/L (ref >=30.5)
LDL PARTICLE NUMBER: 330 nmol/L (ref <1000)
LDL SIZE: 20.9 nm (ref >20.5)
LDL-C: 37 mg/dL (ref 0–99)
LP-IR Score: <25 (ref <=45)
Small LDL Particle Number: <90 nmol/L (ref <=527)
TRIGLYCERIDES BY NMR: 28 mg/dL (ref 0–149)

## 2014-11-28 LAB — THYROID PANEL WITH TSH
Free Thyroxine Index: 2.1 (ref 1.2–4.9)
T3 Uptake Ratio: 25 % (ref 24–39)
T4, Total: 8.2 ug/dL (ref 4.5–12.0)
TSH: 0.694 u[IU]/mL (ref 0.450–4.500)

## 2014-11-28 LAB — VITAMIN D 25 HYDROXY (VIT D DEFICIENCY, FRACTURES): VIT D 25 HYDROXY: 23.7 ng/mL — AB (ref 30.0–100.0)

## 2014-12-03 ENCOUNTER — Telehealth: Payer: Self-pay | Admitting: Nurse Practitioner

## 2014-12-03 NOTE — Telephone Encounter (Signed)
Notes under results

## 2014-12-03 NOTE — Progress Notes (Signed)
lmtcb

## 2015-03-06 ENCOUNTER — Ambulatory Visit: Payer: BLUE CROSS/BLUE SHIELD | Admitting: Nurse Practitioner

## 2016-02-09 ENCOUNTER — Encounter: Payer: Self-pay | Admitting: Family Medicine

## 2016-02-09 ENCOUNTER — Ambulatory Visit (INDEPENDENT_AMBULATORY_CARE_PROVIDER_SITE_OTHER): Payer: BLUE CROSS/BLUE SHIELD | Admitting: Family Medicine

## 2016-02-09 VITALS — BP 132/84 | HR 74 | Temp 97.5°F | Ht 67.0 in | Wt 234.6 lb

## 2016-02-09 DIAGNOSIS — R21 Rash and other nonspecific skin eruption: Secondary | ICD-10-CM | POA: Diagnosis not present

## 2016-02-09 MED ORDER — DESONIDE 0.05 % EX CREA: TOPICAL_CREAM | Freq: Two times a day (BID) | CUTANEOUS | Status: DC

## 2016-02-09 NOTE — Progress Notes (Signed)
   HPI  Patient presents today here with rash around her left eye.  Patient spends over the last 2-3 months she's had this rash. It itches intermittently, sometimes it seems like it's completely resolving, then it returns. She's tried Benadryl cream without improvement. She's tried hydrocortisone with only mild improvement.  She's concerned that it's poison oak or an allergic reaction from another week that she has encountered in the yard.  She denies any pain, loss of vision, extension onto the eye,. She also denies any weeping or drainage from that area. She's not had an injury there. She does not have a dermatologist.  PMH: Smoking status noted ROS: Per HPI  Objective: BP 132/84 mmHg  Pulse 74  Temp(Src) 97.5 F (36.4 C) (Oral)  Ht 5\' 7"  (1.702 m)  Wt 234 lb 9.6 oz (106.414 kg)  BMI 36.73 kg/m2 Gen: NAD, alert, cooperative with exam HEENT: NCAT, conjunctiva clear, does not involve the eyelid margin CV: RRR, good S1/S2, no murmur Resp: CTABL, no wheezes, non-labored Ext: No edema, warm Neuro: Alert and oriented, No gross deficits  Skin Slightly irregular erythematous lesion slightly raised just below the left lower eyelid, smaller papular erythematous area below the right eye. No drainage, warmth, tenderness, or induration.  Assessment and plan:  # Rash Unclear etiology, contact dermatitis is a reasonable first concern. Will treat with desonide. Referring to dermatology, I'm concerned that this will not improve since the hydrocortisone only improved slightly. Discussed reasons to return for care seek emergency medical care. Return as needed    Orders Placed This Encounter  Procedures  . Ambulatory referral to Dermatology    Referral Priority:  Routine    Referral Type:  Consultation    Referral Reason:  Specialty Services Required    Requested Specialty:  Dermatology    Number of Visits Requested:  1    Meds ordered this encounter  Medications  . desonide  (DESOWEN) 0.05 % cream    Sig: Apply topically 2 (two) times daily.    Dispense:  30 g    Refill:  0    Murtis SinkSam Bradshaw, MD [facility] 02/09/2016, 7:52 AM

## 2016-02-09 NOTE — Patient Instructions (Signed)
Great to meet you!  Try the desonide cream for a couple of weeks, if it is not helping you can stop.  You will hear from us about seeing a dermatologist.   Please let us know right away, or seek medical help, if the rash extends onto the white of your eye, it begins to rapily spread, or you have other concerns

## 2016-10-29 IMAGING — CR DG ABDOMEN 1V
1 series · 1 of 1 positions shown · non-contrast
Comparison: None.

CLINICAL DATA: Left lower quadrant abdominal pain.

EXAM:
ABDOMEN - 1 VIEW

[view not recorded]
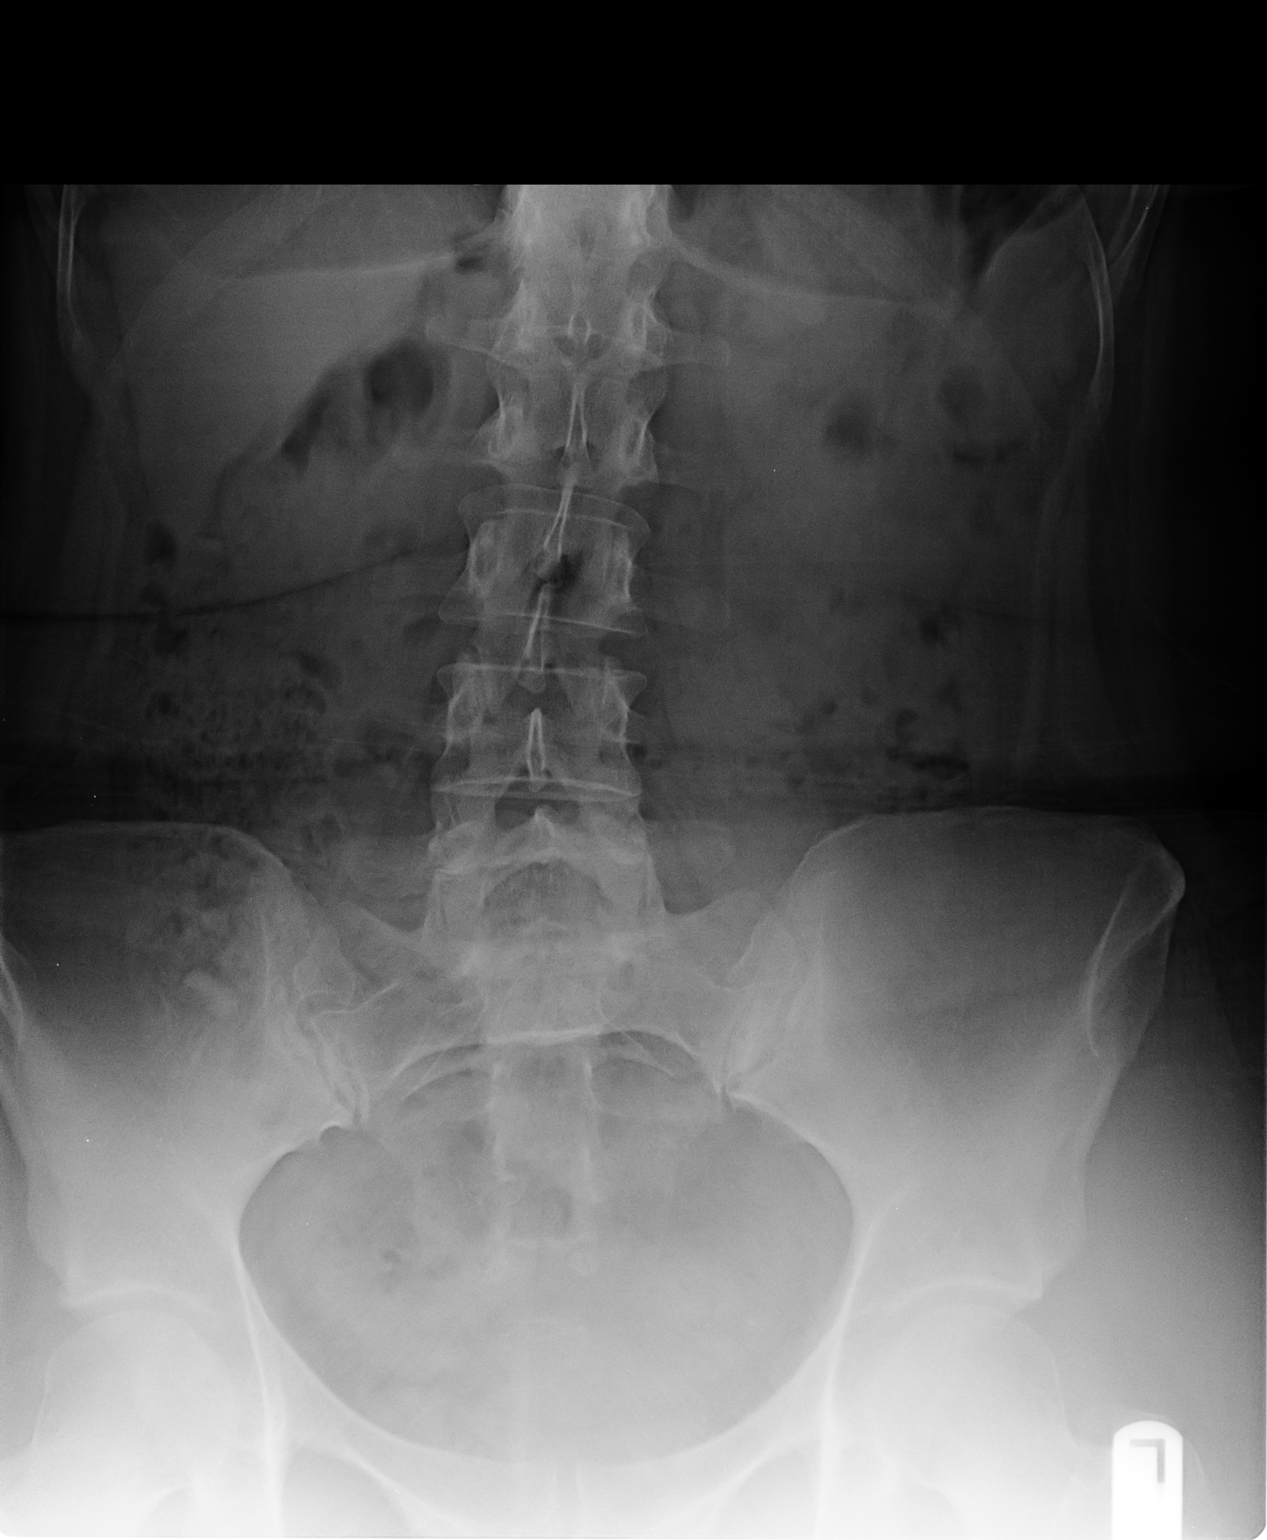

[1 of 1 positions shown; findings below may reference images not displayed]

FINDINGS: Could not exclude a left basilar infiltrate. Recommend two-view
chest x-ray.

The bowel gas pattern is unremarkable. The soft tissue shadows are
maintained. No findings for obstruction or perforation. The bony
structures are grossly normal.
IMPRESSION: Suspect left lower lobe infiltrate.  Recommend two-view chest x-ray.

Unremarkable bowel gas pattern.

## 2017-10-13 ENCOUNTER — Encounter: Payer: Self-pay | Admitting: Nurse Practitioner

## 2017-10-13 ENCOUNTER — Ambulatory Visit: Payer: BLUE CROSS/BLUE SHIELD | Admitting: Nurse Practitioner

## 2017-10-13 VITALS — BP 121/79 | HR 107 | Temp 98.4°F | Ht 67.0 in | Wt 205.0 lb

## 2017-10-13 DIAGNOSIS — L732 Hidradenitis suppurativa: Secondary | ICD-10-CM | POA: Diagnosis not present

## 2017-10-13 MED ORDER — CIPROFLOXACIN HCL 500 MG PO TABS: 500.0000 mg | ORAL_TABLET | Freq: Two times a day (BID) | ORAL | 0 refills | Status: DC

## 2017-10-13 NOTE — Progress Notes (Signed)
   Subjective:    Patient ID: Angela Callahan, female    DOB: March 30, 1981, 37 y.o.   MRN: [id number]  HPI Patient is brought in by her husband today. She is c/o abscess in right axillary area. Started 1 week ago. Slight drainage today.    Review of Systems  Constitutional: Negative.   HENT: Negative.   Respiratory: Negative.   Cardiovascular: Negative.   Genitourinary: Negative.   Neurological: Negative.   Psychiatric/Behavioral: Negative.   All other systems reviewed and are negative.      Objective:   Physical Exam  Constitutional: She is oriented to person, place, and time. She appears well-developed and well-nourished. No distress.  Cardiovascular: Normal rate and regular rhythm.  Pulmonary/Chest: Effort normal and breath sounds normal.  Neurological: She is alert and oriented to person, place, and time.  Skin: Skin is warm.  Erythematous fluid filled area right axillia- appears to have drained some today  Psychiatric: She has a normal mood and affect. Her behavior is normal. Judgment and thought content normal.   BP 121/79   Pulse (!) 107   Temp 98.4 F (36.9 C) (Oral)   Ht 5\' 7"  (1.702 m)   Wt 205 lb (93 kg)   BMI 32.11 kg/m       Assessment & Plan:  1. Hidradenitis axillaris Meds ordered this encounter  Medications  . ciprofloxacin (CIPRO) 500 MG tablet    Sig: Take 1 tablet (500 mg total) by mouth 2 (two) times daily.    Dispense:  20 tablet    Refill:  0    Order Specific Question:   Supervising Provider    Answer:   Johna SheriffVINCENT, CAROL L [[id number]]   Warm compresses Ok to squeeze it if starts draining rto if no better on Monday  Mary-Margaret Daphine DeutscherMartin, FNP

## 2017-10-13 NOTE — Patient Instructions (Signed)
Hidradenitis Suppurativa Hidradenitis suppurativa is a long-term (chronic) skin disease that starts with blocked sweat glands or hair follicles. Bacteria may grow in these blocked openings of your skin. Hidradenitis suppurativa is like a severe form of acne that develops in areas of your body where acne would be unusual. It is most likely to affect the areas of your body where skin rubs against skin and becomes moist. This includes your:  Underarms.  Groin.  Genital areas.  Buttocks.  Upper thighs.  Breasts.  Hidradenitis suppurativa may start out with small pimples. The pimples can develop into deep sores that break open (rupture) and drain pus. Over time your skin may thicken and become scarred. Hidradenitis suppurativa cannot be passed from person to person. What are the causes? The exact cause of hidradenitis suppurativa is not known. This condition may be due to:  Female and female hormones. The condition is rare before and after puberty.  An overactive body defense system (immune system). Your immune system may overreact to the blocked hair follicles or sweat glands and cause swelling and pus-filled sores.  What increases the risk? You may have a higher risk of hidradenitis suppurativa if you:  Are a woman.  Are between ages 11 and 55.  Have a family history of hidradenitis suppurativa.  Have a personal history of acne.  Are overweight.  Smoke.  Take the drug lithium.  What are the signs or symptoms? The first signs of an outbreak are usually painful skin bumps that look like pimples. As the condition progresses:  Skin bumps may get bigger and grow deeper into the skin.  Bumps under the skin may rupture and drain smelly pus.  Skin may become itchy and infected.  Skin may thicken and scar.  Drainage may continue through tunnels under the skin (fistulas).  Walking and moving your arms can become painful.  How is this diagnosed? Your health care provider may  diagnose hidradenitis suppurativa based on your medical history and your signs and symptoms. A physical exam will also be done. You may need to see a health care provider who specializes in skin diseases (dermatologist). You may also have tests done to confirm the diagnosis. These can include:  Swabbing a sample of pus or drainage from your skin so it can be sent to the lab and tested for infection.  Blood tests to check for infection.  How is this treated? The same treatment will not work for everybody with hidradenitis suppurativa. Your treatment will depend on how severe your symptoms are. You may need to try several treatments to find what works best for you. Part of your treatment may include cleaning and bandaging (dressing) your wounds. You may also have to take medicines, such as the following:  Antibiotics.  Acne medicines.  Medicines to block or suppress the immune system.  A diabetes medicine (metformin) is sometimes used to treat this condition.  For women, birth control pills can sometimes help relieve symptoms.  You may need surgery if you have a severe case of hidradenitis suppurativa that does not respond to medicine. Surgery may involve:  Using a laser to clear the skin and remove hair follicles.  Opening and draining deep sores.  Removing the areas of skin that are diseased and scarred.  Follow these instructions at home:  Learn as much as you can about your disease, and work closely with your health care providers.  Take medicines only as directed by your health care provider.  If you were prescribed   an antibiotic medicine, finish it all even if you start to feel better.  If you are overweight, losing weight may be very helpful. Try to reach and maintain a healthy weight.  Do not use any tobacco products, including cigarettes, chewing tobacco, or electronic cigarettes. If you need help quitting, ask your health care provider.  Do not shave the areas where you  get hidradenitis suppurativa.  Do not wear deodorant.  Wear loose-fitting clothes.  Try not to overheat and get sweaty.  Take a daily bleach bath as directed by your health care provider. ? Fill your bathtub halfway with water. ? Pour in  cup of unscented household bleach. ? Soak for 5-10 minutes.  Cover sore areas with a warm, clean washcloth (compress) for 5-10 minutes. Contact a health care provider if:  You have a flare-up of hidradenitis suppurativa.  You have chills or a fever.  You are having trouble controlling your symptoms at home. This information is not intended to replace advice given to you by your health care provider. Make sure you discuss any questions you have with your health care provider. Document Released: 03/29/2004 Document Revised: 01/21/2016 Document Reviewed: 11/15/2013 Elsevier Interactive Patient Education  2018 Elsevier Inc.  

## 2017-11-06 ENCOUNTER — Ambulatory Visit: Payer: BLUE CROSS/BLUE SHIELD | Admitting: Family Medicine

## 2017-11-06 ENCOUNTER — Encounter: Payer: Self-pay | Admitting: Family Medicine

## 2017-11-06 VITALS — BP 133/83 | HR 91 | Temp 99.7°F | Ht 67.0 in | Wt 205.0 lb

## 2017-11-06 DIAGNOSIS — J020 Streptococcal pharyngitis: Secondary | ICD-10-CM | POA: Diagnosis not present

## 2017-11-06 LAB — RAPID STREP SCREEN (MED CTR MEBANE ONLY): Strep Gp A Ag, IA W/Reflex: POSITIVE — AB

## 2017-11-06 MED ORDER — AMOXICILLIN 500 MG PO CAPS: 500.0000 mg | ORAL_CAPSULE | Freq: Two times a day (BID) | ORAL | 0 refills | Status: AC

## 2017-11-06 NOTE — Progress Notes (Signed)
Subjective: CC: strep throat PCP: Bennie Pierini, FNP [id number] [name] is a 37 y.o. female presenting to clinic today for:  1. Strep throat Patient reports a 4-day history of sore throat.  Denies associated headache, rash, cough, congestion, nausea, vomiting or fevers.  She does note that she has some soreness in her neck but otherwise has only had a sore throat.  She has been using OTC analgesics.  She notes that 3 of her children have been recently positive for strep.   ROS: Per HPI  No Known Allergies Past Medical History:  Diagnosis Date  . No pertinent past medical history     Current Outpatient Medications:  .  ciprofloxacin (CIPRO) 500 MG tablet, Take 1 tablet (500 mg total) by mouth 2 (two) times daily., Disp: 20 tablet, Rfl: 0 Social History   Socioeconomic History  . Marital status: Married    Spouse name: Not on file  . Number of children: Not on file  . Years of education: Not on file  . Highest education level: Not on file  Social Needs  . Financial resource strain: Not on file  . Food insecurity - worry: Not on file  . Food insecurity - inability: Not on file  . Transportation needs - medical: Not on file  . Transportation needs - non-medical: Not on file  Occupational History  . Not on file  Tobacco Use  . Smoking status: Never Smoker  . Smokeless tobacco: Never Used  Substance and Sexual Activity  . Alcohol use: No  . Drug use: No  . Sexual activity: Yes    Birth control/protection: None  Other Topics Concern  . Not on file  Social History Narrative  . Not on file   Family History  Problem Relation Age of Onset  . Anesthesia problems Neg Hx   . Hypotension Neg Hx   . Malignant hyperthermia Neg Hx     Objective: Office vital signs reviewed. BP 133/83   Pulse 91   Temp 99.7 F (37.6 C) (Oral)   Ht 5\' 7"  (1.702 m)   Wt 205 lb (93 kg)   BMI 32.11 kg/m   Physical Examination:  General: Awake, alert, well nourished,  nontoxic, No acute distress HEENT: Normal    Neck: No masses palpated.  Enlarged bilateral anterior cervical lymph nodes.    Ears: Tympanic membranes intact, normal light reflex, no erythema, no bulging    Eyes: PERRLA, extraocular membranes intact, sclera white    Nose: nasal turbinates moist, no nasal discharge    Throat: moist mucus membranes, moderate oropharyngeal erythema.  No tonsil enlargement.  Airway is patent Cardio: regular rate and rhythm, S1S2 heard, no murmurs appreciated Pulm: clear to auscultation bilaterally, no wheezes, rhonchi or rales; normal work of breathing on room air  Assessment/ Plan: 37 y.o. female   1. Strep pharyngitis Patient with low-grade fever to 99.7 F.  Vital signs otherwise within normal limits.  Her physical exam was remarkable for bilateral enlargement of anterior cervical lymph nodes and moderate oropharyngeal erythema.  Rapid strep was positive.  Amoxicillin 500 mg p.o. twice daily for 10 days prescribed.  Home care instructions reviewed with the patient.  Patient did change out her toothbrush in 2 days.  Return precautions discussed.  Reasons for emergent evaluation emergency department also reviewed.  Patient was good understanding will follow-up as needed. - Culture, Group A Strep - Rapid Strep Screen (Not at [facility])   Orders Placed This Encounter  Procedures  . Culture,  Group A Strep    Order Specific Question:   Source    Answer:   oral  . Rapid Strep Screen (Not at [facility])   Meds ordered this encounter  Medications  . amoxicillin (AMOXIL) 500 MG capsule    Sig: Take 1 capsule (500 mg total) by mouth 2 (two) times daily for 10 days.    Dispense:  20 capsule    Refill:  0     Angela Mckenna Hulen SkainsM Serigne Kubicek, DO [facility] [phone]

## 2017-11-17 ENCOUNTER — Ambulatory Visit: Payer: BLUE CROSS/BLUE SHIELD | Admitting: Family Medicine

## 2017-11-17 ENCOUNTER — Encounter: Payer: Self-pay | Admitting: Family Medicine

## 2017-11-17 VITALS — BP 124/76 | HR 79 | Temp 98.3°F | Ht 67.0 in | Wt 200.0 lb

## 2017-11-17 DIAGNOSIS — J0301 Acute recurrent streptococcal tonsillitis: Secondary | ICD-10-CM

## 2017-11-17 DIAGNOSIS — B001 Herpesviral vesicular dermatitis: Secondary | ICD-10-CM

## 2017-11-17 LAB — RAPID STREP SCREEN (MED CTR MEBANE ONLY): STREP GP A AG, IA W/REFLEX: POSITIVE — AB

## 2017-11-17 MED ORDER — VALACYCLOVIR HCL 1 G PO TABS: 2000.0000 mg | ORAL_TABLET | Freq: Two times a day (BID) | ORAL | 0 refills | Status: AC

## 2017-11-17 MED ORDER — PENICILLIN G BENZATHINE 1200000 UNIT/2ML IM SUSP
1.2000 10*6.[IU] | Freq: Once | INTRAMUSCULAR | Status: AC
  Administered 2017-11-17: 1.2 10*6.[IU] via INTRAMUSCULAR

## 2017-11-17 NOTE — Patient Instructions (Signed)
You are given an intramuscular dose of penicillin today to cover for recurrent strep throat.  This will stay in your system for the next 3-4 weeks.  If you develop yet another strep throat after that time, the next step would be referral to ear nose and throat for further evaluation and management.  Cold Sore A cold sore, also called a fever blister, is a skin infection that is caused by a virus. This infection causes small, fluid-filled sores to form inside of the mouth or on the lips, gums, nose, chin, or cheeks. Cold sores can spread to other parts of the body, such as the eyes or fingers. Cold sores can be spread or passed from person to person (contagious) until the sores crust over completely. Cold sores can be spread through close contact, such as kissing or sharing a drinking glass. Follow these instructions at home: Medicines  Take or apply over-the-counter and prescription medicines only as told by your doctor.  Use a cotton-tip swab to apply creams or gels to your sores. Sore Care  Do not touch the sores or pick the scabs.  Wash your hands often. Do not touch your eyes without washing your hands first.  Keep the sores clean and dry.  If directed, apply ice to the sores:  Put ice in a plastic bag.  Place a towel between your skin and the bag.  Leave the ice on for 20 minutes, 2-3 times per day. Lifestyle  Do not kiss, have oral sex, or share personal items until your sores heal.  Eat a soft, bland diet. Avoid eating hot, cold, or salty foods. These can hurt your mouth.  Use a straw if it hurts to drink out of a glass.  Avoid the sun and limit your stress if these things trigger outbreaks. If sun causes cold sores, apply sunscreen on your lips before being out in the sun. Contact a doctor if:  You have symptoms for more than two weeks.  You have pus coming from the sores.  You have redness that is spreading.  You have pain or irritation in your eye.  You get  sores on your genitals.  Your sores do not heal within two weeks.  You get cold sores often. Get help right away if:  You have a fever and your symptoms suddenly get worse.  You have a headache and confusion. This information is not intended to replace advice given to you by your health care provider. Make sure you discuss any questions you have with your health care provider. Document Released: 02/14/2012 Document Revised: 01/21/2016 Document Reviewed: 06/05/2015 Elsevier Interactive Patient Education  [employer]2018 Elsevier Inc.

## 2017-11-17 NOTE — Progress Notes (Signed)
Subjective: CC: ?Strep PCP: Bennie Pierini, FNP [id number] Angela Callahan is a 37 y.o. female presenting to clinic today for:  1. Strep Patient notes onset of sore throat yesterday.  She notes predominantly left-sided throat pain.  Denies nausea, vomiting, headache, rash, fevers.  She was recently treated on 11/06/2017 for positive strep throat with amoxicillin.  She notes she completed about 8 days before discontinuing the medicine.  She reports that she sterilized everything and switched out her toothbrush.  Her child is currently sick with similar.  2. Cold sore Patient notes that she develops cold sores multiple times per year.  She is used Abreva with little improvement in symptoms.  She points to the left lower lip as the most recent one.  She notes that she developed this today.  She is having regular menstrual cycles.  No alcohol use.  No history of liver dysfunction.   ROS: Per HPI  No Known Allergies Past Medical History:  Diagnosis Date  . No pertinent past medical history    No current outpatient medications on file. Social History   Socioeconomic History  . Marital status: Married    Spouse name: Not on file  . Number of children: Not on file  . Years of education: Not on file  . Highest education level: Not on file  Occupational History  . Not on file  Social Needs  . Financial resource strain: Not on file  . Food insecurity:    Worry: Not on file    Inability: Not on file  . Transportation needs:    Medical: Not on file    Non-medical: Not on file  Tobacco Use  . Smoking status: Never Smoker  . Smokeless tobacco: Never Used  Substance and Sexual Activity  . Alcohol use: No  . Drug use: No  . Sexual activity: Yes    Birth control/protection: None  Lifestyle  . Physical activity:    Days per week: Not on file    Minutes per session: Not on file  . Stress: Not on file  Relationships  . Social connections:    Talks on phone: Not on file    Gets  together: Not on file    Attends religious service: Not on file    Active member of club or organization: Not on file    Attends meetings of clubs or organizations: Not on file    Relationship status: Not on file  . Intimate partner violence:    Fear of current or ex partner: Not on file    Emotionally abused: Not on file    Physically abused: Not on file    Forced sexual activity: Not on file  Other Topics Concern  . Not on file  Social History Narrative  . Not on file   Family History  Problem Relation Age of Onset  . Anesthesia problems Neg Hx   . Hypotension Neg Hx   . Malignant hyperthermia Neg Hx     Objective: Office vital signs reviewed. BP 124/76   Pulse 79   Temp 98.3 F (36.8 C) (Oral)   Ht 5\' 7"  (1.702 m)   Wt 200 lb (90.7 kg)   BMI 31.32 kg/m   Physical Examination:  General: Awake, alert, well nourished, No acute distress HEENT: Normal    Neck: No masses palpated. No lymphadenopathy    Ears: Tympanic membranes intact, normal light reflex, no erythema, no bulging    Eyes: PERRLA, extraocular membranes intact, sclera white  Nose: nasal turbinates moist, no nasal discharge    Throat: moist mucus membranes, moderate oropharyngeal erythema, no tonsillar exudate.  Airway is patent Cardio: regular rate and rhythm, S1S2 heard, no murmurs appreciated Pulm: clear to auscultation bilaterally, no wheezes, rhonchi or rales; normal work of breathing on room air Skin: no rash  Assessment/ Plan: 37 y.o. female   1. Acute recurrent streptococcal tonsillitis Rapid strep rapidly positive.  She only completed 8 out of 10 days of the amoxicillin earlier this month.  Difficult to tell whether this is a treatment failure versus simply incomplete treatment.  Given recurrence, she was treated with penicillin G IM, she tolerated injection well.  If she continues to have persistent strep throat versus recurrence, we will plan for referral to ear nose and throat. - Rapid Strep  Screen (Not at [facility]) - penicillin g benzathine (BICILLIN LA) 1200000 UNIT/2ML injection 1.2 Million Units  2. Fever blister Valtrex 2 g p.o. twice daily times 1 day per episode.  Home care instructions reviewed.  Handout was provided.   Orders Placed This Encounter  Procedures  . Rapid Strep Screen (Not at [facility])   Meds ordered this encounter  Medications  . valACYclovir (VALTREX) 1000 MG tablet    Sig: Take 2 tablets (2,000 mg total) by mouth 2 (two) times daily for 1 day. (if needed for fever blister flares)    Dispense:  20 tablet    Refill:  0  . penicillin g benzathine (BICILLIN LA) 1200000 UNIT/2ML injection 1.2 Million Units     Raliegh IpAshly M Gottschalk, DO [facility] [phone]

## 2017-11-21 ENCOUNTER — Ambulatory Visit: Payer: BLUE CROSS/BLUE SHIELD | Admitting: Nurse Practitioner

## 2017-11-21 ENCOUNTER — Encounter: Payer: Self-pay | Admitting: Nurse Practitioner

## 2017-11-21 VITALS — BP 123/85 | HR 79 | Temp 98.2°F | Ht 67.0 in | Wt 200.8 lb

## 2017-11-21 DIAGNOSIS — R200 Anesthesia of skin: Secondary | ICD-10-CM | POA: Diagnosis not present

## 2017-11-21 NOTE — Progress Notes (Signed)
   Subjective:    Patient ID: Angela Callahan, female    DOB: 1980-09-06, 37 y.o.   MRN: [id number]  HPI Patient come sin today with c/o left arm numbness and tingling. Was all the way down to her fingers. She was helping her husband and was doing a lot of arm movement and occurred later that evening. She came here and saw triage and they checked her out and said she was fine because her blood pressure was good.    Review of Systems  Constitutional: Negative.   HENT: Negative.   Respiratory: Negative.   Cardiovascular: Negative.   Musculoskeletal: Negative.   Neurological: Positive for numbness (left arm- lasted about 2-3 hours- not bothering her today.).  All other systems reviewed and are negative.      Objective:   Physical Exam  Constitutional: She is oriented to person, place, and time. She appears well-developed and well-nourished. No distress.  Cardiovascular: Normal rate and regular rhythm.  Pulmonary/Chest: Effort normal and breath sounds normal.  Musculoskeletal: Normal range of motion.  FROM of cervical spine without pain or numbness FROM of left shoulder without pain or numbness Grips equal bil Motor strength and sensation distally intact   Neurological: She is alert and oriented to person, place, and time.  Skin: Skin is warm.  Psychiatric: She has a normal mood and affect. Her behavior is normal. Judgment and thought content normal.   BP 123/85   Pulse 79   Temp 98.2 F (36.8 C) (Oral)   Ht 5\' 7"  (1.702 m)   Wt 200 lb 12.8 oz (91.1 kg)   BMI 31.45 kg/m       Assessment & Plan:   1. Left arm numbness    Has resolved Keep diary of ovccurences Motrin if need If occurs and does not resolve RTO  Mary-Margaret Daphine DeutscherMartin, FNP

## 2017-12-19 ENCOUNTER — Ambulatory Visit: Payer: BLUE CROSS/BLUE SHIELD | Admitting: Nurse Practitioner

## 2017-12-19 ENCOUNTER — Encounter: Payer: Self-pay | Admitting: Nurse Practitioner

## 2017-12-19 VITALS — BP 152/86 | HR 70 | Temp 98.5°F | Ht 67.0 in | Wt 204.0 lb

## 2017-12-19 DIAGNOSIS — L732 Hidradenitis suppurativa: Secondary | ICD-10-CM | POA: Diagnosis not present

## 2017-12-19 MED ORDER — SULFAMETHOXAZOLE-TRIMETHOPRIM 800-160 MG PO TABS: 1.0000 | ORAL_TABLET | Freq: Two times a day (BID) | ORAL | 0 refills | Status: DC

## 2017-12-19 NOTE — Progress Notes (Signed)
   Subjective:    Patient ID: Angela Callahan, female    DOB: August 11, 1981, 37 y.o.   MRN: [id number]  HPI patient comes in c/o cyst on axillia area.she was seen on 10/13/17 with same complaint. She did not want to have it lanced. She was given cipro and it went away. She come sin today c/o 3 bumps in that [place]. Sore and tender to touch.    Review of Systems  Respiratory: Negative.   Cardiovascular: Negative.   Skin: Rash: right axillia.  Psychiatric/Behavioral: Negative.   All other systems reviewed and are negative.      Objective:   Physical Exam  Constitutional: She is oriented to person, place, and time. She appears well-developed and well-nourished. No distress.  Cardiovascular: Normal rate and regular rhythm.  Pulmonary/Chest: Effort normal and breath sounds normal.  Neurological: She is alert and oriented to person, place, and time.  Skin: Skin is warm and dry.  3 2cm erythematous sore lesion in right axillary area.  Psychiatric: She has a normal mood and affect. Her behavior is normal. Judgment and thought content normal.   BP (!) 152/86   Pulse 70   Temp 98.5 F (36.9 C) (Oral)   Ht 5\' 7"  (1.702 m)   Wt 204 lb (92.5 kg)   BMI 31.95 kg/m      Assessment & Plan:  1. Hidradenitis axillaris Avoid antiperspirants Shave down only and not up Warm compresses'if does not get better may need to see a surgeon - sulfamethoxazole-trimethoprim (BACTRIM DS) 800-160 MG tablet; Take 1 tablet by mouth 2 (two) times daily.  Dispense: 20 tablet; Refill: 0  Mary-Margaret Daphine DeutscherMartin, FNP

## 2017-12-19 NOTE — Patient Instructions (Signed)
Hidradenitis Suppurativa Hidradenitis suppurativa is a long-term (chronic) skin disease that starts with blocked sweat glands or hair follicles. Bacteria may grow in these blocked openings of your skin. Hidradenitis suppurativa is like a severe form of acne that develops in areas of your body where acne would be unusual. It is most likely to affect the areas of your body where skin rubs against skin and becomes moist. This includes your:  Underarms.  Groin.  Genital areas.  Buttocks.  Upper thighs.  Breasts.  Hidradenitis suppurativa may start out with small pimples. The pimples can develop into deep sores that break open (rupture) and drain pus. Over time your skin may thicken and become scarred. Hidradenitis suppurativa cannot be passed from person to person. What are the causes? The exact cause of hidradenitis suppurativa is not known. This condition may be due to:  Female and female hormones. The condition is rare before and after puberty.  An overactive body defense system (immune system). Your immune system may overreact to the blocked hair follicles or sweat glands and cause swelling and pus-filled sores.  What increases the risk? You may have a higher risk of hidradenitis suppurativa if you:  Are a woman.  Are between ages 11 and 55.  Have a family history of hidradenitis suppurativa.  Have a personal history of acne.  Are overweight.  Smoke.  Take the drug lithium.  What are the signs or symptoms? The first signs of an outbreak are usually painful skin bumps that look like pimples. As the condition progresses:  Skin bumps may get bigger and grow deeper into the skin.  Bumps under the skin may rupture and drain smelly pus.  Skin may become itchy and infected.  Skin may thicken and scar.  Drainage may continue through tunnels under the skin (fistulas).  Walking and moving your arms can become painful.  How is this diagnosed? Your health care provider may  diagnose hidradenitis suppurativa based on your medical history and your signs and symptoms. A physical exam will also be done. You may need to see a health care provider who specializes in skin diseases (dermatologist). You may also have tests done to confirm the diagnosis. These can include:  Swabbing a sample of pus or drainage from your skin so it can be sent to the lab and tested for infection.  Blood tests to check for infection.  How is this treated? The same treatment will not work for everybody with hidradenitis suppurativa. Your treatment will depend on how severe your symptoms are. You may need to try several treatments to find what works best for you. Part of your treatment may include cleaning and bandaging (dressing) your wounds. You may also have to take medicines, such as the following:  Antibiotics.  Acne medicines.  Medicines to block or suppress the immune system.  A diabetes medicine (metformin) is sometimes used to treat this condition.  For women, birth control pills can sometimes help relieve symptoms.  You may need surgery if you have a severe case of hidradenitis suppurativa that does not respond to medicine. Surgery may involve:  Using a laser to clear the skin and remove hair follicles.  Opening and draining deep sores.  Removing the areas of skin that are diseased and scarred.  Follow these instructions at home:  Learn as much as you can about your disease, and work closely with your health care providers.  Take medicines only as directed by your health care provider.  If you were prescribed   an antibiotic medicine, finish it all even if you start to feel better.  If you are overweight, losing weight may be very helpful. Try to reach and maintain a healthy weight.  Do not use any tobacco products, including cigarettes, chewing tobacco, or electronic cigarettes. If you need help quitting, ask your health care provider.  Do not shave the areas where you  get hidradenitis suppurativa.  Do not wear deodorant.  Wear loose-fitting clothes.  Try not to overheat and get sweaty.  Take a daily bleach bath as directed by your health care provider. ? Fill your bathtub halfway with water. ? Pour in  cup of unscented household bleach. ? Soak for 5-10 minutes.  Cover sore areas with a warm, clean washcloth (compress) for 5-10 minutes. Contact a health care provider if:  You have a flare-up of hidradenitis suppurativa.  You have chills or a fever.  You are having trouble controlling your symptoms at home. This information is not intended to replace advice given to you by your health care provider. Make sure you discuss any questions you have with your health care provider. Document Released: 03/29/2004 Document Revised: 01/21/2016 Document Reviewed: 11/15/2013 Elsevier Interactive Patient Education  2018 Elsevier Inc.  

## 2018-04-16 ENCOUNTER — Ambulatory Visit: Payer: BLUE CROSS/BLUE SHIELD | Admitting: Physician Assistant

## 2018-04-16 ENCOUNTER — Encounter: Payer: Self-pay | Admitting: Physician Assistant

## 2018-04-16 VITALS — BP 118/77 | HR 91 | Temp 98.9°F | Ht 67.0 in | Wt 211.0 lb

## 2018-04-16 DIAGNOSIS — Z1231 Encounter for screening mammogram for malignant neoplasm of breast: Secondary | ICD-10-CM | POA: Diagnosis not present

## 2018-04-16 DIAGNOSIS — L732 Hidradenitis suppurativa: Secondary | ICD-10-CM | POA: Diagnosis not present

## 2018-04-16 DIAGNOSIS — Z1239 Encounter for other screening for malignant neoplasm of breast: Secondary | ICD-10-CM

## 2018-04-16 MED ORDER — CLINDAMYCIN HCL 300 MG PO CAPS: 300.0000 mg | ORAL_CAPSULE | Freq: Three times a day (TID) | ORAL | 5 refills | Status: AC

## 2018-04-16 NOTE — Patient Instructions (Signed)
Hidradenitis Suppurativa Hidradenitis suppurativa is a long-term (chronic) skin disease that starts with blocked sweat glands or hair follicles. Bacteria may grow in these blocked openings of your skin. Hidradenitis suppurativa is like a severe form of acne that develops in areas of your body where acne would be unusual. It is most likely to affect the areas of your body where skin rubs against skin and becomes moist. This includes your:  Underarms.  Groin.  Genital areas.  Buttocks.  Upper thighs.  Breasts.  Hidradenitis suppurativa may start out with small pimples. The pimples can develop into deep sores that break open (rupture) and drain pus. Over time your skin may thicken and become scarred. Hidradenitis suppurativa cannot be passed from person to person. What are the causes? The exact cause of hidradenitis suppurativa is not known. This condition may be due to:  Female and female hormones. The condition is rare before and after puberty.  An overactive body defense system (immune system). Your immune system may overreact to the blocked hair follicles or sweat glands and cause swelling and pus-filled sores.  What increases the risk? You may have a higher risk of hidradenitis suppurativa if you:  Are a woman.  Are between ages 11 and 55.  Have a family history of hidradenitis suppurativa.  Have a personal history of acne.  Are overweight.  Smoke.  Take the drug lithium.  What are the signs or symptoms? The first signs of an outbreak are usually painful skin bumps that look like pimples. As the condition progresses:  Skin bumps may get bigger and grow deeper into the skin.  Bumps under the skin may rupture and drain smelly pus.  Skin may become itchy and infected.  Skin may thicken and scar.  Drainage may continue through tunnels under the skin (fistulas).  Walking and moving your arms can become painful.  How is this diagnosed? Your health care provider may  diagnose hidradenitis suppurativa based on your medical history and your signs and symptoms. A physical exam will also be done. You may need to see a health care provider who specializes in skin diseases (dermatologist). You may also have tests done to confirm the diagnosis. These can include:  Swabbing a sample of pus or drainage from your skin so it can be sent to the lab and tested for infection.  Blood tests to check for infection.  How is this treated? The same treatment will not work for everybody with hidradenitis suppurativa. Your treatment will depend on how severe your symptoms are. You may need to try several treatments to find what works best for you. Part of your treatment may include cleaning and bandaging (dressing) your wounds. You may also have to take medicines, such as the following:  Antibiotics.  Acne medicines.  Medicines to block or suppress the immune system.  A diabetes medicine (metformin) is sometimes used to treat this condition.  For women, birth control pills can sometimes help relieve symptoms.  You may need surgery if you have a severe case of hidradenitis suppurativa that does not respond to medicine. Surgery may involve:  Using a laser to clear the skin and remove hair follicles.  Opening and draining deep sores.  Removing the areas of skin that are diseased and scarred.  Follow these instructions at home:  Learn as much as you can about your disease, and work closely with your health care providers.  Take medicines only as directed by your health care provider.  If you were prescribed   an antibiotic medicine, finish it all even if you start to feel better.  If you are overweight, losing weight may be very helpful. Try to reach and maintain a healthy weight.  Do not use any tobacco products, including cigarettes, chewing tobacco, or electronic cigarettes. If you need help quitting, ask your health care provider.  Do not shave the areas where you  get hidradenitis suppurativa.  Do not wear deodorant.  Wear loose-fitting clothes.  Try not to overheat and get sweaty.  Take a daily bleach bath as directed by your health care provider. ? Fill your bathtub halfway with water. ? Pour in  cup of unscented household bleach. ? Soak for 5-10 minutes.  Cover sore areas with a warm, clean washcloth (compress) for 5-10 minutes. Contact a health care provider if:  You have a flare-up of hidradenitis suppurativa.  You have chills or a fever.  You are having trouble controlling your symptoms at home. This information is not intended to replace advice given to you by your health care provider. Make sure you discuss any questions you have with your health care provider. Document Released: 03/29/2004 Document Revised: 01/21/2016 Document Reviewed: 11/15/2013 Elsevier Interactive Patient Education  2018 Elsevier Inc.  

## 2018-04-18 NOTE — Progress Notes (Signed)
BP 118/77 (BP Location: Left Arm)   Pulse 91   Temp 98.9 F (37.2 C) (Oral)   Ht 5\' 7"  (1.702 m)   Wt 211 lb (95.7 kg)   LMP 04/07/2018   BMI 33.05 kg/m    Subjective:    Patient ID: Angela Callahan, female    DOB: 1981/08/15, 37 y.o.   MRN: [id number]  HPI: Angela Callahan is a [age] y.o. female presenting on 04/16/2018 for knots under arm (R>L)  This patient has had outbreaks of cyst in her axilla area.  She has been treated with antibiotics in the past.  She has never been diagnosed with hidradenitis suppurativa.  We have had a long discussion about the typical course that it takes.  She is having the recurrences just like that.  She has made an attempt to change her deodorant.  She is avoided deodorant over the past couple of weeks when this infection got started.  She has tried to change her razors frequently.  Discussed the possibility of having laser surgery to have the hair removed completely.  I have advised her to get in touch with her insurance to see if this is a possibility that they will cover it with a participating dermatologist. She also has an need for breast cancer mammography screening and we will schedule that at our office.  Past Medical History:  Diagnosis Date  . No pertinent past medical history    Relevant past medical, surgical, family and social history reviewed and updated as indicated. Interim medical history since our last visit reviewed. Allergies and medications reviewed and updated. DATA REVIEWED: CHART IN EPIC  Family History reviewed for pertinent findings.  Review of Systems  Constitutional: Negative.   HENT: Negative.   Eyes: Negative.   Respiratory: Negative.   Gastrointestinal: Negative.   Genitourinary: Negative.   Skin: Positive for color change and wound.    Allergies as of 04/16/2018   No Known Allergies     Medication List        Accurate as of 04/16/18 11:59 PM. Always use your most recent med list.            clindamycin 300 MG capsule Commonly known as:  CLEOCIN Take 1 capsule (300 mg total) by mouth 3 (three) times daily. may take one daily for prevention          Objective:    BP 118/77 (BP Location: Left Arm)   Pulse 91   Temp 98.9 F (37.2 C) (Oral)   Ht 5\' 7"  (1.702 m)   Wt 211 lb (95.7 kg)   LMP 04/07/2018   BMI 33.05 kg/m   No Known Allergies  Wt Readings from Last 3 Encounters:  04/16/18 211 lb (95.7 kg)  12/19/17 204 lb (92.5 kg)  11/21/17 200 lb 12.8 oz (91.1 kg)    Physical Exam  Constitutional: She is oriented to person, place, and time. She appears well-developed and well-nourished.  HENT:  Head: Normocephalic and atraumatic.  Eyes: Pupils are equal, round, and reactive to light. Conjunctivae and EOM are normal.  Cardiovascular: Normal rate, regular rhythm, normal heart sounds and intact distal pulses.  Pulmonary/Chest: Effort normal and breath sounds normal.  Abdominal: Soft. Bowel sounds are normal.  Neurological: She is alert and oriented to person, place, and time. She has normal reflexes.  Skin: Skin is warm and dry. No rash noted.  Multiple subcutaneous layer in both axilla.  There is mild tenderness.  There is no acute  drainage.  Psychiatric: She has a normal mood and affect. Her behavior is normal. Judgment and thought content normal.        Assessment & Plan:   1. Axillary hidradenitis suppurativa - clindamycin (CLEOCIN) 300 MG capsule; Take 1 capsule (300 mg total) by mouth 3 (three) times daily. may take one daily for prevention  Dispense: 30 capsule; Refill: 5  2. Breast cancer screening - MM Digital Screening; Future   Continue all other maintenance medications as listed above.  Follow up plan: No follow-ups on file.  Educational handout given for hidradenitis suppurativa   Remus LofflerAngel S. Kelvis Berger PA-C [facility] [address]  [place], [place] [address] [phone]   04/18/2018, 10:16 PM

## 2018-05-09 ENCOUNTER — Telehealth: Payer: Self-pay | Admitting: Nurse Practitioner

## 2018-06-26 LAB — HM MAMMOGRAPHY
